# Patient Record
Sex: Female | Born: 1955 | Race: Black or African American | Hispanic: No | Marital: Single | Smoking: Never smoker
Health system: Southern US, Community
[De-identification: ages and names within clinical notes are randomized; demographics above are authoritative.]

## PROBLEM LIST (undated history)

## (undated) DIAGNOSIS — Z950 Presence of cardiac pacemaker: Secondary | ICD-10-CM

## (undated) DIAGNOSIS — E119 Type 2 diabetes mellitus without complications: Secondary | ICD-10-CM

## (undated) DIAGNOSIS — E785 Hyperlipidemia, unspecified: Secondary | ICD-10-CM

## (undated) DIAGNOSIS — I10 Essential (primary) hypertension: Secondary | ICD-10-CM

## (undated) DIAGNOSIS — I499 Cardiac arrhythmia, unspecified: Secondary | ICD-10-CM

## (undated) DIAGNOSIS — Z86711 Personal history of pulmonary embolism: Secondary | ICD-10-CM

## (undated) DIAGNOSIS — R0789 Other chest pain: Secondary | ICD-10-CM

## (undated) DIAGNOSIS — E876 Hypokalemia: Secondary | ICD-10-CM

## (undated) HISTORY — DX: Presence of cardiac pacemaker: Z95.0

## (undated) HISTORY — DX: Hyperlipidemia, unspecified: E78.5

## (undated) HISTORY — PX: HERNIA REPAIR: SHX51

## (undated) HISTORY — DX: Personal history of pulmonary embolism: Z86.711

## (undated) HISTORY — PX: INSERT / REPLACE / REMOVE PACEMAKER: SUR710

---

## 2002-07-23 HISTORY — PX: ABDOMINAL HYSTERECTOMY: SHX81

## 2003-11-23 DIAGNOSIS — Z950 Presence of cardiac pacemaker: Secondary | ICD-10-CM

## 2003-11-23 HISTORY — DX: Presence of cardiac pacemaker: Z95.0

## 2004-08-22 HISTORY — PX: PACEMAKER INSERTION: SHX728

## 2007-02-16 ENCOUNTER — Emergency Department (HOSPITAL_COMMUNITY): Admission: EM | Admit: 2007-02-16 | Discharge: 2007-02-16 | Payer: Self-pay | Admitting: Emergency Medicine

## 2007-02-20 ENCOUNTER — Emergency Department (HOSPITAL_COMMUNITY): Admission: EM | Admit: 2007-02-20 | Discharge: 2007-02-20 | Payer: Self-pay | Admitting: Emergency Medicine

## 2007-10-26 ENCOUNTER — Encounter: Admission: RE | Admit: 2007-10-26 | Discharge: 2007-10-26 | Payer: Self-pay | Admitting: Family Medicine

## 2008-12-14 ENCOUNTER — Emergency Department (HOSPITAL_COMMUNITY): Admission: EM | Admit: 2008-12-14 | Discharge: 2008-12-14 | Payer: Self-pay | Admitting: Emergency Medicine

## 2008-12-29 ENCOUNTER — Emergency Department (HOSPITAL_COMMUNITY): Admission: EM | Admit: 2008-12-29 | Discharge: 2008-12-29 | Payer: Self-pay | Admitting: Emergency Medicine

## 2009-02-26 ENCOUNTER — Encounter: Admission: RE | Admit: 2009-02-26 | Discharge: 2009-02-26 | Payer: Self-pay | Admitting: Infectious Diseases

## 2009-05-26 ENCOUNTER — Inpatient Hospital Stay (HOSPITAL_COMMUNITY): Admission: EM | Admit: 2009-05-26 | Discharge: 2009-05-28 | Payer: Self-pay | Admitting: Emergency Medicine

## 2009-08-18 ENCOUNTER — Inpatient Hospital Stay (HOSPITAL_COMMUNITY): Admission: RE | Admit: 2009-08-18 | Discharge: 2009-08-23 | Payer: Self-pay | Admitting: General Surgery

## 2009-09-27 ENCOUNTER — Emergency Department (HOSPITAL_COMMUNITY): Admission: EM | Admit: 2009-09-27 | Discharge: 2009-09-27 | Payer: Self-pay | Admitting: Emergency Medicine

## 2009-10-05 ENCOUNTER — Emergency Department (HOSPITAL_COMMUNITY): Admission: EM | Admit: 2009-10-05 | Discharge: 2009-10-05 | Payer: Self-pay | Admitting: Emergency Medicine

## 2009-10-08 ENCOUNTER — Encounter: Admission: RE | Admit: 2009-10-08 | Discharge: 2009-10-08 | Payer: Self-pay | Admitting: General Surgery

## 2009-10-11 ENCOUNTER — Emergency Department (HOSPITAL_COMMUNITY): Admission: EM | Admit: 2009-10-11 | Discharge: 2009-10-11 | Payer: Self-pay | Admitting: Emergency Medicine

## 2009-10-12 ENCOUNTER — Emergency Department (HOSPITAL_COMMUNITY): Admission: EM | Admit: 2009-10-12 | Discharge: 2009-10-12 | Payer: Self-pay | Admitting: Emergency Medicine

## 2010-06-30 ENCOUNTER — Encounter: Admission: RE | Admit: 2010-06-30 | Discharge: 2010-06-30 | Payer: Self-pay | Admitting: Family Medicine

## 2010-08-26 IMAGING — CR DG CHEST 2V
2 series · 2 of 2 positions shown · non-contrast
Comparison: Portable chest x-ray of 02/16/2007

CLINICAL DATA: Chest pain, dizziness

CHEST - 2 VIEW

[w chest pa]
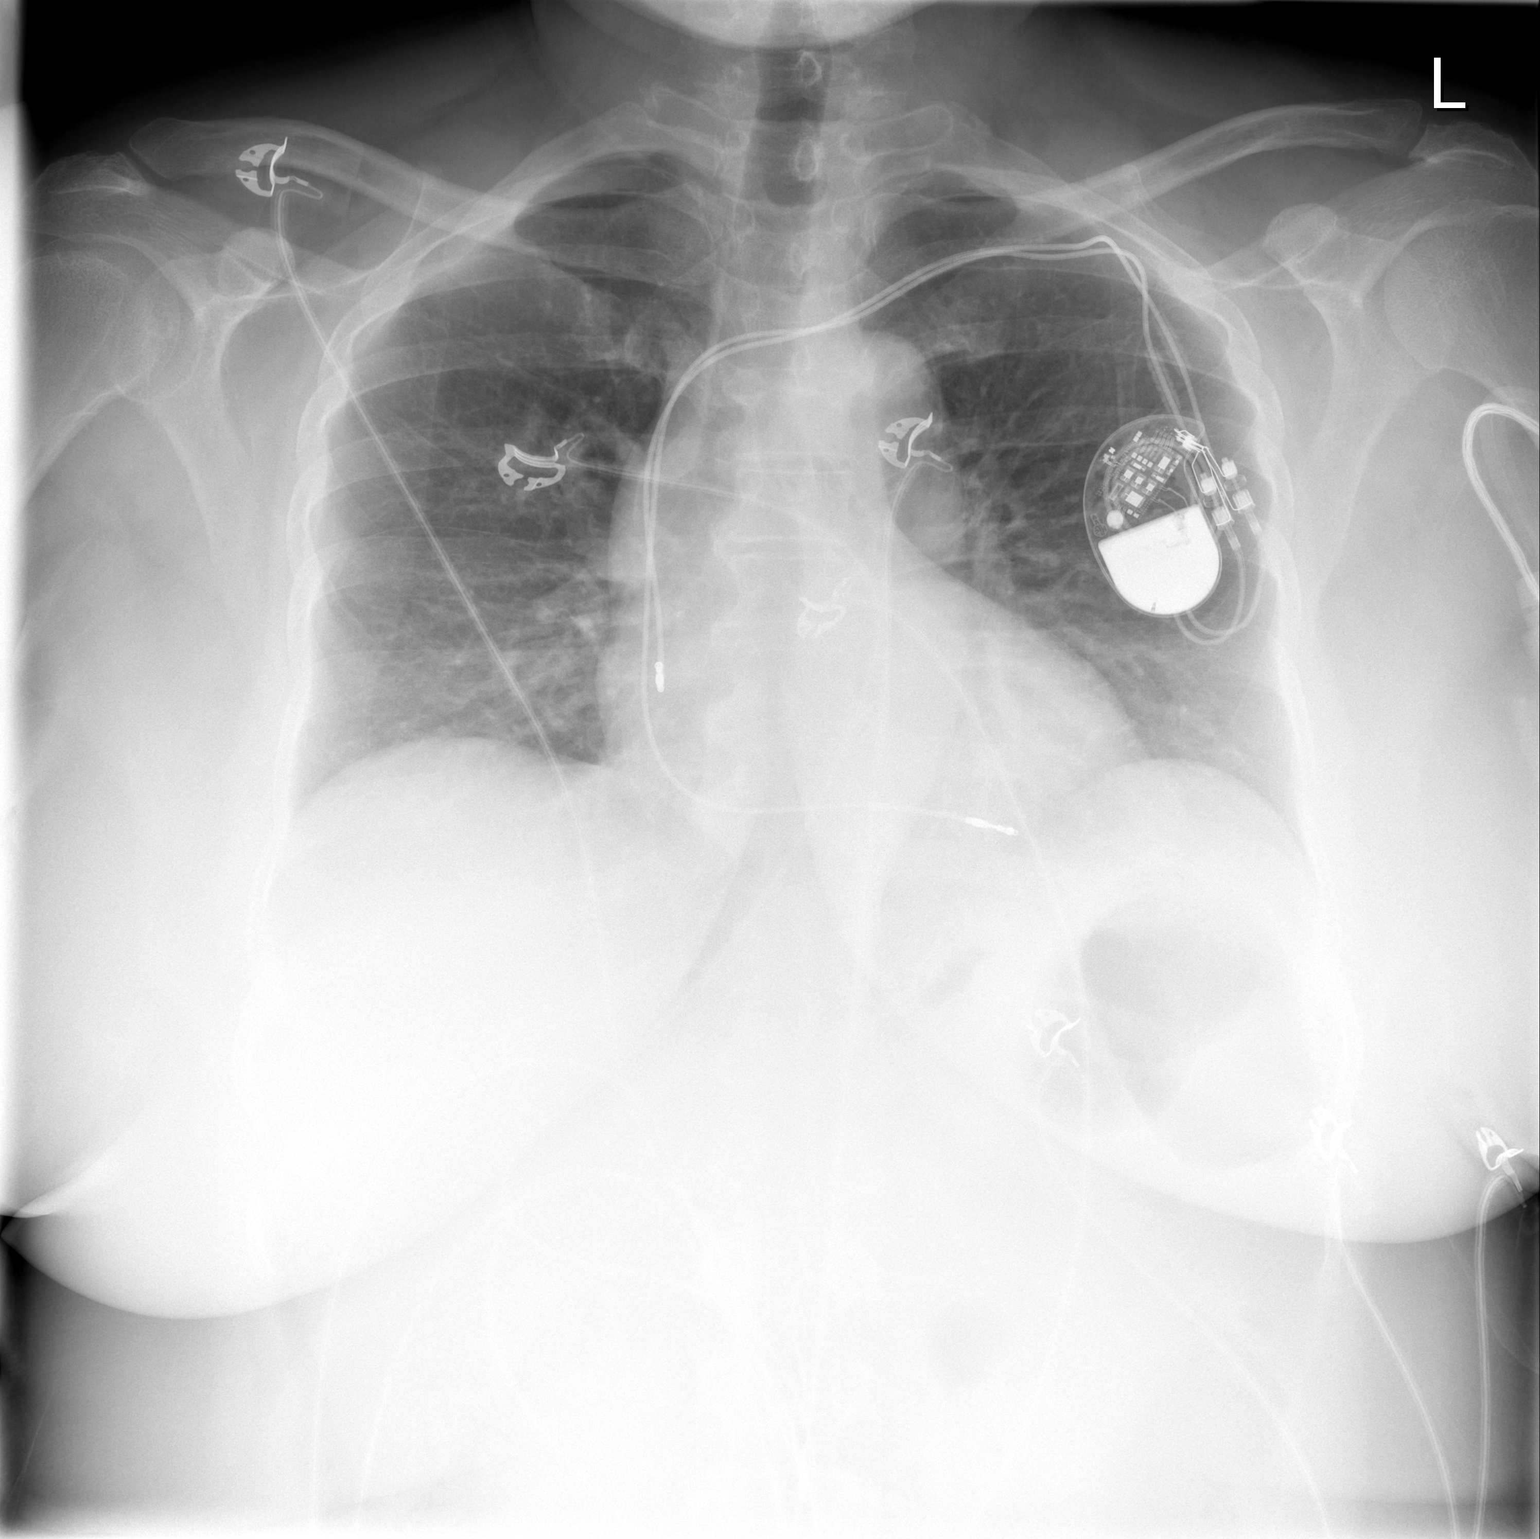

[w chest lat]
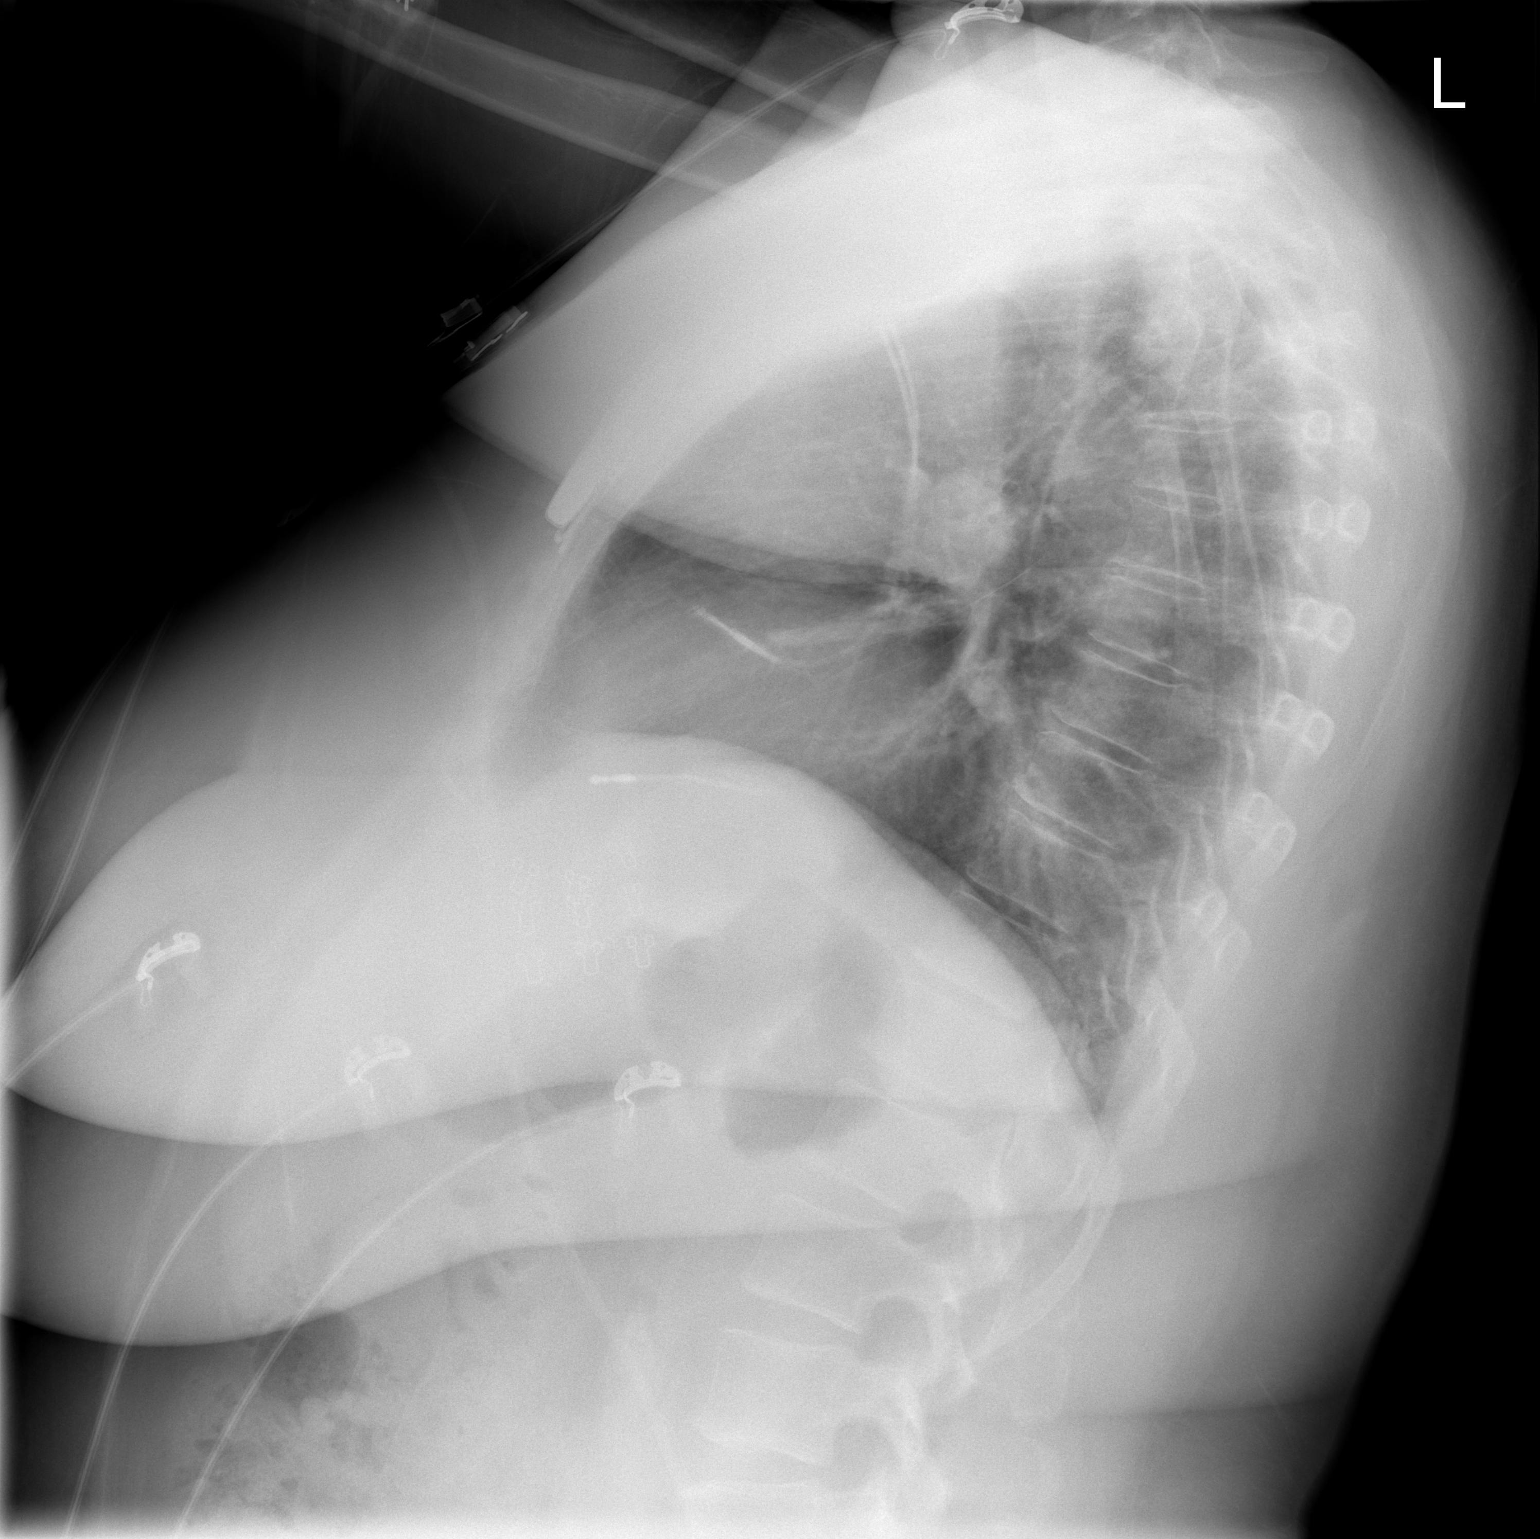

[2 of 2 positions shown; findings below may reference images not displayed]

FINDINGS: The lungs are clear.  Mild cardiomegaly is stable.  A
permanent pacemaker remains.
IMPRESSION: Stable chest x-ray with permanent pacemaker.  No active lung
disease.

## 2010-12-13 ENCOUNTER — Encounter: Payer: Self-pay | Admitting: Family Medicine

## 2011-02-25 LAB — CBC
HCT: 36.1 % (ref 36.0–46.0)
MCHC: 33.4 g/dL (ref 30.0–36.0)
MCV: 84.1 fL (ref 78.0–100.0)
Platelets: 281 10*3/uL (ref 150–400)
RBC: 4.29 MIL/uL (ref 3.87–5.11)

## 2011-02-25 LAB — GLUCOSE, CAPILLARY
Glucose-Capillary: 102 mg/dL — ABNORMAL HIGH (ref 70–99)
Glucose-Capillary: 114 mg/dL — ABNORMAL HIGH (ref 70–99)
Glucose-Capillary: 127 mg/dL — ABNORMAL HIGH (ref 70–99)
Glucose-Capillary: 140 mg/dL — ABNORMAL HIGH (ref 70–99)

## 2011-02-25 LAB — DIFFERENTIAL
Basophils Absolute: 0 10*3/uL (ref 0.0–0.1)
Basophils Relative: 0 % (ref 0–1)

## 2011-02-26 LAB — URINALYSIS, MICROSCOPIC ONLY
Ketones, ur: NEGATIVE mg/dL
Leukocytes, UA: NEGATIVE
Nitrite: NEGATIVE
Specific Gravity, Urine: 1.015 (ref 1.005–1.030)
Urobilinogen, UA: 0.2 mg/dL (ref 0.0–1.0)
pH: 7 (ref 5.0–8.0)

## 2011-02-26 LAB — URINE CULTURE: Culture: NO GROWTH

## 2011-02-26 LAB — CBC
HCT: 34.7 % — ABNORMAL LOW (ref 36.0–46.0)
HCT: 42.3 % (ref 36.0–46.0)
Hemoglobin: 14 g/dL (ref 12.0–15.0)
MCHC: 32.9 g/dL (ref 30.0–36.0)
MCHC: 33 g/dL (ref 30.0–36.0)
MCV: 83.7 fL (ref 78.0–100.0)
MCV: 83.9 fL (ref 78.0–100.0)
MCV: 84.4 fL (ref 78.0–100.0)
Platelets: 232 10*3/uL (ref 150–400)
Platelets: 250 10*3/uL (ref 150–400)
RBC: 5.01 MIL/uL (ref 3.87–5.11)
RDW: 16.1 % — ABNORMAL HIGH (ref 11.5–15.5)
RDW: 16.2 % — ABNORMAL HIGH (ref 11.5–15.5)
WBC: 12.6 10*3/uL — ABNORMAL HIGH (ref 4.0–10.5)
WBC: 4.9 10*3/uL (ref 4.0–10.5)

## 2011-02-26 LAB — COMPREHENSIVE METABOLIC PANEL
ALT: 22 U/L (ref 0–35)
AST: 18 U/L (ref 0–37)
Alkaline Phosphatase: 121 U/L — ABNORMAL HIGH (ref 39–117)
Calcium: 9.5 mg/dL (ref 8.4–10.5)
GFR calc Af Amer: >60 mL/min (ref >60)
GFR calc non Af Amer: >60 mL/min (ref >60)
Glucose, Bld: 152 mg/dL — ABNORMAL HIGH (ref 70–99)
Sodium: 138 mEq/L (ref 135–145)
Total Bilirubin: 0.6 mg/dL (ref 0.3–1.2)

## 2011-02-26 LAB — LIPID PANEL
HDL: 52 mg/dL (ref >39)
LDL Cholesterol: 117 mg/dL — ABNORMAL HIGH (ref 0–99)
Total CHOL/HDL Ratio: 3.5 RATIO
VLDL: 14 mg/dL (ref 0–40)

## 2011-02-26 LAB — GLUCOSE, CAPILLARY
Glucose-Capillary: 106 mg/dL — ABNORMAL HIGH (ref 70–99)
Glucose-Capillary: 114 mg/dL — ABNORMAL HIGH (ref 70–99)
Glucose-Capillary: 127 mg/dL — ABNORMAL HIGH (ref 70–99)
Glucose-Capillary: 132 mg/dL — ABNORMAL HIGH (ref 70–99)
Glucose-Capillary: 15 mg/dL — CL (ref 70–99)
Glucose-Capillary: 210 mg/dL — ABNORMAL HIGH (ref 70–99)

## 2011-02-26 LAB — URINALYSIS, ROUTINE W REFLEX MICROSCOPIC
Glucose, UA: NEGATIVE mg/dL
Nitrite: NEGATIVE
Specific Gravity, Urine: 1.023 (ref 1.005–1.030)
pH: 6 (ref 5.0–8.0)

## 2011-02-26 LAB — BASIC METABOLIC PANEL
BUN: 16 mg/dL (ref 6–23)
CO2: 24 mEq/L (ref 19–32)
Calcium: 8.8 mg/dL (ref 8.4–10.5)
Chloride: 105 mEq/L (ref 96–112)
Creatinine, Ser: 1.01 mg/dL (ref 0.4–1.2)
GFR calc Af Amer: >60 mL/min (ref >60)
GFR calc non Af Amer: 58 mL/min — ABNORMAL LOW (ref >60)
Glucose, Bld: 113 mg/dL — ABNORMAL HIGH (ref 70–99)
Glucose, Bld: 136 mg/dL — ABNORMAL HIGH (ref 70–99)
Potassium: 4 mEq/L (ref 3.5–5.1)
Potassium: 4.4 mEq/L (ref 3.5–5.1)
Sodium: 138 mEq/L (ref 135–145)

## 2011-02-26 LAB — DIFFERENTIAL
Lymphocytes Relative: 43 % (ref 12–46)
Lymphs Abs: 2.1 10*3/uL (ref 0.7–4.0)
Neutro Abs: 2.4 10*3/uL (ref 1.7–7.7)
Neutrophils Relative %: 49 % (ref 43–77)

## 2011-02-26 LAB — URINE MICROSCOPIC-ADD ON

## 2011-02-28 LAB — DIFFERENTIAL
Basophils Absolute: 0 10*3/uL (ref 0.0–0.1)
Basophils Relative: 0 % (ref 0–1)
Eosinophils Absolute: 0 10*3/uL (ref 0.0–0.7)
Eosinophils Absolute: 0.1 10*3/uL (ref 0.0–0.7)
Eosinophils Relative: 1 % (ref 0–5)
Lymphs Abs: 1 10*3/uL (ref 0.7–4.0)
Lymphs Abs: 2.7 10*3/uL (ref 0.7–4.0)
Monocytes Absolute: 0.3 10*3/uL (ref 0.1–1.0)
Monocytes Relative: 3 % (ref 3–12)
Neutrophils Relative %: 40 % — ABNORMAL LOW (ref 43–77)
Neutrophils Relative %: 86 % — ABNORMAL HIGH (ref 43–77)

## 2011-02-28 LAB — URINALYSIS, ROUTINE W REFLEX MICROSCOPIC
Nitrite: NEGATIVE
Specific Gravity, Urine: 1.023 (ref 1.005–1.030)
Urobilinogen, UA: 1 mg/dL (ref 0.0–1.0)
pH: 7 (ref 5.0–8.0)

## 2011-02-28 LAB — COMPREHENSIVE METABOLIC PANEL
ALT: 19 U/L (ref 0–35)
AST: 26 U/L (ref 0–37)
Albumin: 3.9 g/dL (ref 3.5–5.2)
Calcium: 9.7 mg/dL (ref 8.4–10.5)
GFR calc Af Amer: >60 mL/min (ref >60)
Glucose, Bld: 156 mg/dL — ABNORMAL HIGH (ref 70–99)
Sodium: 137 mEq/L (ref 135–145)
Total Protein: 7.2 g/dL (ref 6.0–8.3)

## 2011-02-28 LAB — URINE MICROSCOPIC-ADD ON

## 2011-02-28 LAB — BASIC METABOLIC PANEL
CO2: 28 mEq/L (ref 19–32)
Calcium: 8.9 mg/dL (ref 8.4–10.5)
GFR calc Af Amer: >60 mL/min (ref >60)
GFR calc non Af Amer: >60 mL/min (ref >60)
Sodium: 141 mEq/L (ref 135–145)

## 2011-02-28 LAB — CBC
Hemoglobin: 13.3 g/dL (ref 12.0–15.0)
MCHC: 33.1 g/dL (ref 30.0–36.0)
MCHC: 33.8 g/dL (ref 30.0–36.0)
Platelets: 241 10*3/uL (ref 150–400)
Platelets: 251 10*3/uL (ref 150–400)
RBC: 4.69 MIL/uL (ref 3.87–5.11)
RDW: 15 % (ref 11.5–15.5)

## 2011-02-28 LAB — GLUCOSE, CAPILLARY: Glucose-Capillary: 122 mg/dL — ABNORMAL HIGH (ref 70–99)

## 2011-03-08 LAB — GC/CHLAMYDIA PROBE AMP, GENITAL
Chlamydia, DNA Probe: NEGATIVE
GC Probe Amp, Genital: NEGATIVE

## 2011-03-08 LAB — POCT I-STAT, CHEM 8
BUN: 9 mg/dL (ref 6–23)
Calcium, Ion: 1.24 mmol/L (ref 1.12–1.32)
Chloride: 103 mEq/L (ref 96–112)
Creatinine, Ser: 0.9 mg/dL (ref 0.4–1.2)
TCO2: 25 mmol/L (ref 0–100)

## 2011-03-08 LAB — POCT URINALYSIS DIP (DEVICE)
Bilirubin Urine: NEGATIVE
Glucose, UA: >=1000 mg/dL — AB
Hgb urine dipstick: NEGATIVE
Nitrite: NEGATIVE
Urobilinogen, UA: 0.2 mg/dL (ref 0.0–1.0)

## 2011-03-08 LAB — WET PREP, GENITAL: Yeast Wet Prep HPF POC: NONE SEEN

## 2011-03-09 LAB — GLUCOSE, CAPILLARY: Glucose-Capillary: 163 mg/dL — ABNORMAL HIGH (ref 70–99)

## 2011-03-09 LAB — CBC
Hemoglobin: 15.3 g/dL — ABNORMAL HIGH (ref 12.0–15.0)
MCHC: 33.9 g/dL (ref 30.0–36.0)
MCV: 83.3 fL (ref 78.0–100.0)
RBC: 5.41 MIL/uL — ABNORMAL HIGH (ref 3.87–5.11)
RDW: 14.9 % (ref 11.5–15.5)

## 2011-03-09 LAB — BASIC METABOLIC PANEL
CO2: 25 mEq/L (ref 19–32)
Calcium: 9.9 mg/dL (ref 8.4–10.5)
Chloride: 107 mEq/L (ref 96–112)
Creatinine, Ser: 0.75 mg/dL (ref 0.4–1.2)
GFR calc Af Amer: >60 mL/min (ref >60)
Glucose, Bld: 176 mg/dL — ABNORMAL HIGH (ref 70–99)

## 2011-03-09 LAB — URINALYSIS, ROUTINE W REFLEX MICROSCOPIC
Bilirubin Urine: NEGATIVE
Glucose, UA: 100 mg/dL — AB
Ketones, ur: NEGATIVE mg/dL
Protein, ur: NEGATIVE mg/dL
pH: 7 (ref 5.0–8.0)

## 2011-03-09 LAB — POCT CARDIAC MARKERS
CKMB, poc: <1 ng/mL — ABNORMAL LOW (ref 1.0–8.0)
Troponin i, poc: <0.05 ng/mL (ref 0.00–0.09)

## 2011-04-06 NOTE — H&P (Signed)
Lauren Wilkins, PAGLIARULO NO.:  [id number]   MEDICAL RECORD NO.:  [id number]          PATIENT TYPE:  INP   LOCATION:  5125                         FACILITY:  [facility]   PHYSICIAN:  Ollen Gross. Vernell Morgans, M.D. DATE OF BIRTH:  December 25, 1955   DATE OF ADMISSION:  05/26/2009  DATE OF DISCHARGE:                              HISTORY & PHYSICAL   HISTORY OF PRESENT ILLNESS:  [name] is a 55 year old black female  who presents to the emergency department with abdominal pain that  started last night.  Pain, she described initially as a sharp, a sort of  lower abdominal pain, and then she has had a sort of a dull like that  has persisted.  She denies any fevers or chills.  She vomited 1 time.  She did have a normal bowel movement on Saturday.  No flatus today.  She  otherwise denies any chest pain, shortness of breath, diarrhea, or  dysuria.   PAST MEDICAL HISTORY:  Significant for slow heart rate requiring a  pacer.   PAST SURGICAL HISTORY:  Significant for hysterectomy.   MEDICATIONS:  Diovan and Caduet.   ALLERGIES:  No known drug allergies.   SOCIAL HISTORY:  She denies any tobacco or tobacco products.   FAMILY HISTORY:  Noncontributory.   PHYSICAL EXAMINATION:  VITAL SIGNS:  Her temperature 98, pulse 76, and  blood pressure 128/78.  GENERAL:  She is a well-developed, well-nourished black female, in no  acute distress.  SKIN:  Warm and dry.  No jaundice.  HEENT:  Eyes are anicteric.  Extraocular movements are intact.  Pupils  are equal, round, and reactive to light.  Sclerae nonicteric.  LUNGS:  Clear bilaterally with no use of accessory respiratory muscles.  HEART:  Regular rate and rhythm with an impulse in the left chest.  ABDOMEN:  Soft.  She has some mild diffuse tenderness and mild  distention.  EXTREMITIES:  No cyanosis, clubbing, or edema.  Good strength in arms  and legs.  PSYCHOLOGIC:  She is alert and oriented x3 with no evidence today of  anxiety or  depression.   LABORATORY DATA:  On review of her lab work, it was significant for a  normal white count.  CT scan was reviewed with radiologist and was  significant for what looks like a partial small bowel obstruction  possibly related to ventral hernia on her lower midline.   ASSESSMENT AND PLAN:  This is a 55 year old black female with what looks  like a partial small bowel obstruction possibly related to ventral  hernia.  On palpation of her abdominal wall, I could not feel a lump  consistent with an incarcerated hernia given that her white counts are  normal and her belly is actually pretty soft, I think it would be  reasonable to put her in the hospital for IV hydration and bowel rest.  We will repeat her abdominal x-rays and lab work in the morning.  If  this does not improve then she may need to have a surgery to repair the  hernia and  relieve the bowel obstruction.  She understands this and in agreement  with this treatment plan.  She also saw her cardiologist, Dr. Lynnea Ferrier,  at [facility] this past Thursday and by her report,  told her everything was great.      Ollen Gross. Vernell Morgans, M.D.  Electronically Signed     PST/MEDQ  D:  05/26/2009  T:  05/27/2009  Job:  [id number]

## 2011-05-24 ENCOUNTER — Inpatient Hospital Stay (INDEPENDENT_AMBULATORY_CARE_PROVIDER_SITE_OTHER)
Admission: RE | Admit: 2011-05-24 | Discharge: 2011-05-24 | Disposition: A | Discharge status: Home / Self Care | Payer: 59 | Source: Ambulatory Visit | Attending: Family Medicine | Admitting: Family Medicine

## 2011-05-24 DIAGNOSIS — R197 Diarrhea, unspecified: Secondary | ICD-10-CM

## 2011-05-24 LAB — POCT I-STAT, CHEM 8
BUN: 24 mg/dL — ABNORMAL HIGH (ref 6–23)
Chloride: 100 mEq/L (ref 96–112)
HCT: 48 % — ABNORMAL HIGH (ref 36.0–46.0)
Potassium: 4 mEq/L (ref 3.5–5.1)

## 2011-05-24 LAB — POCT URINALYSIS DIP (DEVICE)
Glucose, UA: 500 mg/dL — AB
Specific Gravity, Urine: 1.01 (ref 1.005–1.030)

## 2011-06-16 ENCOUNTER — Other Ambulatory Visit: Payer: Self-pay | Admitting: Family Medicine

## 2011-06-16 DIAGNOSIS — Z1231 Encounter for screening mammogram for malignant neoplasm of breast: Secondary | ICD-10-CM

## 2011-07-06 ENCOUNTER — Ambulatory Visit: Payer: 59

## 2011-07-08 ENCOUNTER — Ambulatory Visit: Payer: 59

## 2011-07-15 ENCOUNTER — Ambulatory Visit: Payer: 59

## 2011-09-22 ENCOUNTER — Ambulatory Visit
Admission: RE | Admit: 2011-09-22 | Discharge: 2011-09-22 | Disposition: A | Discharge status: Home / Self Care | Payer: 59 | Source: Ambulatory Visit | Attending: Family Medicine | Admitting: Family Medicine

## 2011-09-22 DIAGNOSIS — Z1231 Encounter for screening mammogram for malignant neoplasm of breast: Secondary | ICD-10-CM

## 2011-09-29 ENCOUNTER — Other Ambulatory Visit: Payer: Self-pay | Admitting: Family Medicine

## 2011-09-29 DIAGNOSIS — R928 Other abnormal and inconclusive findings on diagnostic imaging of breast: Secondary | ICD-10-CM

## 2011-10-15 ENCOUNTER — Other Ambulatory Visit: Payer: 59

## 2011-10-23 LAB — HM PAP SMEAR: HM Pap smear: NORMAL

## 2011-11-01 ENCOUNTER — Ambulatory Visit
Admission: RE | Admit: 2011-11-01 | Discharge: 2011-11-01 | Disposition: A | Discharge status: Home / Self Care | Payer: 59 | Source: Ambulatory Visit | Attending: Family Medicine | Admitting: Family Medicine

## 2011-11-01 DIAGNOSIS — R928 Other abnormal and inconclusive findings on diagnostic imaging of breast: Secondary | ICD-10-CM

## 2012-01-20 ENCOUNTER — Emergency Department (HOSPITAL_BASED_OUTPATIENT_CLINIC_OR_DEPARTMENT_OTHER)
Admission: EM | Admit: 2012-01-20 | Discharge: 2012-01-20 | Discharge status: Against Medical Advice | Payer: 59 | Attending: Emergency Medicine | Admitting: Emergency Medicine

## 2012-01-20 ENCOUNTER — Other Ambulatory Visit: Payer: Self-pay

## 2012-01-20 ENCOUNTER — Emergency Department (INDEPENDENT_AMBULATORY_CARE_PROVIDER_SITE_OTHER): Payer: 59

## 2012-01-20 ENCOUNTER — Encounter (HOSPITAL_BASED_OUTPATIENT_CLINIC_OR_DEPARTMENT_OTHER): Payer: Self-pay | Admitting: *Deleted

## 2012-01-20 DIAGNOSIS — E119 Type 2 diabetes mellitus without complications: Secondary | ICD-10-CM | POA: Insufficient documentation

## 2012-01-20 DIAGNOSIS — I10 Essential (primary) hypertension: Secondary | ICD-10-CM | POA: Insufficient documentation

## 2012-01-20 DIAGNOSIS — R079 Chest pain, unspecified: Secondary | ICD-10-CM

## 2012-01-20 DIAGNOSIS — Z950 Presence of cardiac pacemaker: Secondary | ICD-10-CM | POA: Insufficient documentation

## 2012-01-20 DIAGNOSIS — R9431 Abnormal electrocardiogram [ECG] [EKG]: Secondary | ICD-10-CM

## 2012-01-20 DIAGNOSIS — R0789 Other chest pain: Secondary | ICD-10-CM

## 2012-01-20 HISTORY — DX: Essential (primary) hypertension: I10

## 2012-01-20 LAB — BASIC METABOLIC PANEL
Calcium: 10.7 mg/dL — ABNORMAL HIGH (ref 8.4–10.5)
GFR calc Af Amer: 83 mL/min — ABNORMAL LOW (ref >90)
GFR calc non Af Amer: 72 mL/min — ABNORMAL LOW (ref >90)
Glucose, Bld: 156 mg/dL — ABNORMAL HIGH (ref 70–99)
Potassium: 3.2 mEq/L — ABNORMAL LOW (ref 3.5–5.1)
Sodium: 139 mEq/L (ref 135–145)

## 2012-01-20 LAB — CBC
HCT: 40.8 % (ref 36.0–46.0)
Hemoglobin: 14.2 g/dL (ref 12.0–15.0)
MCHC: 34.8 g/dL (ref 30.0–36.0)
MCV: 79.4 fL (ref 78.0–100.0)
Platelets: 277 10*3/uL (ref 150–400)
RBC: 5.14 MIL/uL — ABNORMAL HIGH (ref 3.87–5.11)
RDW: 14.4 % (ref 11.5–15.5)
WBC: 7.2 10*3/uL (ref 4.0–10.5)

## 2012-01-20 MED ORDER — POTASSIUM CHLORIDE CRYS ER 20 MEQ PO TBCR: 40.0000 meq | EXTENDED_RELEASE_TABLET | Freq: Once | ORAL | Status: DC

## 2012-01-20 MED ORDER — ASPIRIN 81 MG PO CHEW
243.0000 mg | CHEWABLE_TABLET | Freq: Once | ORAL | Status: AC
  Administered 2012-01-20: 243 mg via ORAL

## 2012-01-20 MED ORDER — ASPIRIN 81 MG PO CHEW
324.0000 mg | CHEWABLE_TABLET | Freq: Once | ORAL | Status: DC
  Filled 2012-01-20: qty 4

## 2012-01-20 MED ORDER — NITROGLYCERIN 0.4 MG SL SUBL
0.4000 mg | SUBLINGUAL_TABLET | SUBLINGUAL | Status: DC | PRN
  Filled 2012-01-20: qty 25

## 2012-01-20 NOTE — ED Notes (Signed)
Pt c/o midsternal CP since 11pm last night. Constant in nature. Pt felt like it may be heart burn, however tightness did not relieve. Pt took 1 baby aspirin PTA @ 0400. Denies any associated symptoms. Pt has a pacemaker.

## 2012-01-20 NOTE — ED Notes (Signed)
Pt refused offer of any further treatment

## 2012-01-20 NOTE — ED Provider Notes (Signed)
History     CSN: [id number]  Arrival date & time 01/20/12  0531   First MD Initiated Contact with Patient 01/20/12 0532      Chief Complaint  Patient presents with  . Chest Pain    (Consider location/radiation/quality/duration/timing/severity/associated sxs/prior treatment) Patient is a 56 y.o. female presenting with chest pain. The history is provided by the patient.  Chest Pain The chest pain began 3 - 5 hours ago. Duration of episode(s) is 5 hours. Chest pain occurs constantly. The chest pain is resolved. Associated with: Nothing. At its most intense, the pain is at 5/10. The pain is currently at 0/10. The severity of the pain is moderate. The quality of the pain is described as tightness. The pain does not radiate. Pertinent negatives for primary symptoms include no fever, no fatigue, no syncope, no shortness of breath, no cough, no wheezing, no palpitations, no abdominal pain, no nausea, no vomiting, no dizziness and no altered mental status.  Pertinent negatives for associated symptoms include no claudication, no diaphoresis and no near-syncope. She tried aspirin for the symptoms.  Her past medical history is significant for diabetes and hypertension.  Pertinent negatives for past medical history include no CAD and no PE.  Her family medical history is significant for heart disease in family.    at work tonight developed some sternal chest pressure and tightness without any aggravating or alleviating symptoms. She works in the [employer] and denies any exertional activity tonight. She take 1 daily aspirin continue to work with no change in her symptoms. At time she arrived to the emergency department her pain resolved. No cough, cold or congestion. No fevers or chills. No leg pain or leg swelling. No trauma. No history of tobacco use or asthma. No known sick contacts. No history of similar symptoms in the past. She has a pacemaker placed about 7 years ago by [facility] and has no known coronary artery disease.  Past Medical History  Diagnosis Date  . Diabetes mellitus   . Hypertension     Past Surgical History  Procedure Date  . Pacemaker insertion   . Abdominal hysterectomy   . Hernia repair     No family history on file.  History  Substance Use Topics  . Smoking status: Never Smoker   . Smokeless tobacco: Not on file  . Alcohol Use: No    OB History    Grav Para Term Preterm Abortions TAB SAB Ect Mult Living                  Review of Systems  Constitutional: Negative for fever, chills, diaphoresis and fatigue.  HENT: Negative for neck pain and neck stiffness.   Eyes: Negative for pain.  Respiratory: Negative for cough, shortness of breath and wheezing.   Cardiovascular: Positive for chest pain. Negative for palpitations, claudication, syncope and near-syncope.  Gastrointestinal: Negative for nausea, vomiting and abdominal pain.  Genitourinary: Negative for dysuria.  Musculoskeletal: Negative for back pain.  Skin: Negative for rash.  Neurological: Negative for dizziness and headaches.  Psychiatric/Behavioral: Negative for altered mental status.  All other systems reviewed and are negative.    Allergies  Review of patient's allergies indicates no known allergies.  Home Medications   Current Outpatient Rx  Name Route Sig Dispense Refill  . CADUET PO Oral Take by mouth.    . ASPIRIN 81 MG PO TABS Oral Take 81 mg by mouth daily.    Marland Kitchen HYDROCHLOROTHIAZIDE PO Oral  Take by mouth.    . METFORMIN HCL 500 MG PO TABS Oral Take 500 mg by mouth 2 (two) times daily with a meal.    . BYSTOLIC PO Oral Take by mouth.      BP 131/85  Pulse 68  Temp(Src) 97.4 F (36.3 C) (Oral)  Resp 16  Ht 5\' 6"  (1.676 m)  Wt 240 lb (108.863 kg)  BMI 38.74 kg/m2  SpO2 100%  Physical Exam  Constitutional: She is oriented to person, place, and time. She appears well-developed and well-nourished.  HENT:  Head: Normocephalic and  atraumatic.  Eyes: Conjunctivae and EOM are normal. Pupils are equal, round, and reactive to light.  Neck: Trachea normal. Neck supple. No thyromegaly present.  Cardiovascular: Normal rate, regular rhythm, S1 normal, S2 normal and normal pulses.     No systolic murmur is present   No diastolic murmur is present  Pulses:      Radial pulses are 2+ on the right side, and 2+ on the left side.  Pulmonary/Chest: Effort normal and breath sounds normal. She has no wheezes. She has no rhonchi. She has no rales. She exhibits no tenderness.  Abdominal: Soft. Normal appearance and bowel sounds are normal. There is no tenderness. There is no CVA tenderness and negative Murphy's sign.  Musculoskeletal:       BLE:s Calves nontender, no cords or erythema, negative Homans sign  Neurological: She is alert and oriented to person, place, and time. She has normal strength. No cranial nerve deficit or sensory deficit. GCS eye subscore is 4. GCS verbal subscore is 5. GCS motor subscore is 6.  Skin: Skin is warm and dry. No rash noted. She is not diaphoretic.  Psychiatric: Her speech is normal.       Cooperative and appropriate    ED Course  Procedures (including critical care time)  Results for orders placed during the hospital encounter of 01/20/12  CBC      Component Value Range   WBC 7.2  4.0 - 10.5 (K/uL)   RBC 5.14 (*) 3.87 - 5.11 (MIL/uL)   Hemoglobin 14.2  12.0 - 15.0 (g/dL)   HCT 40.9  81.1 - 91.4 (%)   MCV 79.4  78.0 - 100.0 (fL)   MCH 27.6  26.0 - 34.0 (pg)   MCHC 34.8  30.0 - 36.0 (g/dL)   RDW 78.2  95.6 - 21.3 (%)   Platelets 277  150 - 400 (K/uL)  BASIC METABOLIC PANEL      Component Value Range   Sodium 139  135 - 145 (mEq/L)   Potassium 3.2 (*) 3.5 - 5.1 (mEq/L)   Chloride 102  96 - 112 (mEq/L)   CO2 25  19 - 32 (mEq/L)   Glucose, Bld 156 (*) 70 - 99 (mg/dL)   BUN 15  6 - 23 (mg/dL)   Creatinine, Ser 0.86  0.50 - 1.10 (mg/dL)   Calcium 57.8 (*) 8.4 - 10.5 (mg/dL)   GFR calc non Af  Amer 72 (*) >90 (mL/min)   GFR calc Af Amer 83 (*) >90 (mL/min)  TROPONIN I      Component Value Range   Troponin I <0.30  <0.30 (ng/mL)    Date: 01/20/2012  Rate: 69  Rhythm: normal sinus rhythm  QRS Axis: normal  Intervals: normal  ST/T Wave abnormalities: nonspecific ST/T changes  Conduction Disutrbances:none  Narrative Interpretation: New inverted T waves V3 V4 V5 T wave flattening in V6 versus 05/26/2009  Old EKG Reviewed: changes noted  1. Chest pain     6:49 AM I had a very long discussion with patient regarding EKG changes, risk factors for heart disease and ACS, and my very high recommendations that she be admitted to the hospital for further cardiac evaluation and evaluation by her cardiologist. Patient has been pain-free since she the emergency department, she understands that her initial blood work including CBC and troponin are normal range, and that does not by any means excluded acute MI, ACS or other serious potential etiology of her symptoms. She is alert and oriented x4, she has no indication for IVC, she verbalizes understanding risks associated with leaving AMA including missed MI and possibly death. She wishes to be discharged home to see her cardiologist as an outpatient. She states understanding multiple times I highly recommend she be admitted for further evaluation. I offered to speak with her cardiologist or one of his partners and she still declines transfer or admission. She understands that she can return anytime she changes her mind. AMA form signed.   MDM   Chest pain with EKG changes and risk factors for ACS. I have a recommended admission and further evaluation and patient declines. She wants to see her cardiologist in the clinic you know she understands that with admission she potentialywill see her cardiologist or one of his partners. Despite this she signs out AMA        Sunnie Nielsen, MD 01/20/12 [phone]

## 2012-01-20 NOTE — ED Notes (Signed)
Pt continues to deny chest tightness. States she feels at her baseline.

## 2012-01-20 NOTE — Discharge Instructions (Signed)
I highly recommend that you be admitted to the hospital for further evaluation of your chest pain and abnormal EKG. Your risk factors for heart disease in your symptoms could represent a heart attack. Return here anytime he change of mind about being admitted. Otherwise followup with your cardiologist immediately for further evaluation.      Heart Attack in Women  Heart attack (myocardial infarction) is one of the leading causes of sudden, unexpected death in women. Early recognition of heart attack symptoms is critical. Do not ignore heart attack symptoms. If you experience heart attack symptoms, get immediate help. Early treatment helps reduce heart damage.  CAUSES  A heart attack happens because the heart (coronary) arteries become blocked by fatty deposits (plaque) or blood clots. This reduces the oxygen and blood supply to the heart. When one or more of the heart arteries becomes blocked, that area of the heart will begin to die, causing the pain felt during a heart attack.  RISK FACTORS  In women, as the level of estrogen in the blood decreases after menopause, the risk of heart attack increases. Other risk factors of heart attack in women include:  High blood pressure.  High cholesterol levels.  Diabetes.  Smoking.  Obesity.  Menopause.  Hysterectomy.  Previous heart attack.  Lack of regular exercise.  Family history of heart attacks.  SYMPTOMS  In women, heart attack symptoms may be different than those in men. Women may not experience the typical chest discomfort or pain, which is considered the primary heart attack symptom in men. Women may describe a feeling of pressure, ache, or tightness in the chest. Women may experience new or different physical symptoms sometimes a month or more before a heart attack. Unusual, unexplained fatigue may be the most frequently identified symptom. Sleep disturbances and weakness in the arms may also be considered warning signs.  Other heart attack  symptoms that may occur more often in women are:  Unexplained feelings of nervousness or anxiety.  Discomfort between the shoulder blades.  Tingling in the hands and arms.  Swollen arms.  Headaches.  Heart attack symptoms for both men and women include:  Pain or discomfort spreading to the neck, shoulder, arm, or jaw.  Shortness of breath.  Sudden cold sweats.  Pain or discomfort in the abdomen.  Heartburn or indigestion with or without vomiting.  Sudden lightheadedness.  Sudden fainting or blackout.  PREVENTION  The following healthy lifestyle habits may help decrease your risk of heart attacks:  Quitting smoking.  Keeping your blood pressure, blood sugar, and cholesterol levels within normal limits.  Maintaining a healthy weight.  Staying physically active and exercising regularly.  Decreasing your salt intake.  Eating a diet low in saturated fats and cholesterol.  Increasing your fiber intake by including whole grains, vegetables, and fruits in your diet.  Avoiding situations that cause stress, anger, or depression.  Taking medicine as advised by your caregiver.  SEEK IMMEDIATE MEDICAL CARE IF:  You have pain or discomfort in the middle of your chest.  You have pain or discomfort in the upper part of your body, such as the arms, back, neck, jaw, or stomach.  You develop shortness of breath.  You break out into a cold sweat.  You feel nauseous or lightheaded.  You have a fainting episode.  You feel very unusual weakness.  You feel that your heart is pounding very hard or is beating irregularly.  MAKE SURE YOU:  Understand these instructions.  Will watch your  condition.  Will get help right away if you are not doing well or get worse.

## 2012-01-20 NOTE — ED Notes (Signed)
Pt transported to XR at this time.

## 2012-01-20 NOTE — ED Notes (Signed)
AMA form signed by patient

## 2012-01-20 NOTE — ED Notes (Signed)
Pt reports lying down helped to improve the chest tightness, and denies any tightness at this time.

## 2012-01-20 NOTE — ED Notes (Signed)
Dr. Opitz at bedside. 

## 2012-01-20 NOTE — ED Notes (Signed)
Pt reports pacemaker is on demand only.

## 2012-01-20 NOTE — ED Notes (Addendum)
Pt states she does not want to be admitted into the hospital. Pt reports resolved pain, and states that d/t financial reasons, "i just dont want to put myself into further debt by being in the hospital". Pt understands the risks of leaving. Dr Dierdre Highman spoke to her at length regarding her test results and why he feels like she needs further examination at the hospital. Pt states she understands the risks of leaving, and is still requesting to sign out AMA. Pt states she will call her cardiologist today for f/u. States she already has an appt tomorrow with PMD.

## 2012-01-22 ENCOUNTER — Emergency Department (HOSPITAL_COMMUNITY)
Admission: EM | Admit: 2012-01-22 | Discharge: 2012-01-22 | Disposition: A | Discharge status: Home / Self Care | Payer: 59 | Attending: Emergency Medicine | Admitting: Emergency Medicine

## 2012-01-22 ENCOUNTER — Encounter (HOSPITAL_COMMUNITY): Payer: Self-pay | Admitting: Emergency Medicine

## 2012-01-22 ENCOUNTER — Other Ambulatory Visit: Payer: Self-pay

## 2012-01-22 DIAGNOSIS — R0789 Other chest pain: Secondary | ICD-10-CM

## 2012-01-22 DIAGNOSIS — Z79899 Other long term (current) drug therapy: Secondary | ICD-10-CM | POA: Insufficient documentation

## 2012-01-22 DIAGNOSIS — Z7982 Long term (current) use of aspirin: Secondary | ICD-10-CM | POA: Insufficient documentation

## 2012-01-22 DIAGNOSIS — E876 Hypokalemia: Secondary | ICD-10-CM

## 2012-01-22 DIAGNOSIS — R9431 Abnormal electrocardiogram [ECG] [EKG]: Secondary | ICD-10-CM | POA: Insufficient documentation

## 2012-01-22 DIAGNOSIS — Z950 Presence of cardiac pacemaker: Secondary | ICD-10-CM | POA: Insufficient documentation

## 2012-01-22 DIAGNOSIS — E119 Type 2 diabetes mellitus without complications: Secondary | ICD-10-CM | POA: Diagnosis present

## 2012-01-22 DIAGNOSIS — I10 Essential (primary) hypertension: Secondary | ICD-10-CM | POA: Insufficient documentation

## 2012-01-22 HISTORY — DX: Type 2 diabetes mellitus without complications: E11.9

## 2012-01-22 HISTORY — DX: Hypokalemia: E87.6

## 2012-01-22 HISTORY — DX: Other chest pain: R07.89

## 2012-01-22 HISTORY — DX: Cardiac arrhythmia, unspecified: I49.9

## 2012-01-22 LAB — POCT I-STAT TROPONIN I: Troponin i, poc: 0 ng/mL (ref 0.00–0.08)

## 2012-01-22 LAB — CBC
MCV: 82.2 fL (ref 78.0–100.0)
Platelets: 262 10*3/uL (ref 150–400)
RBC: 5.34 MIL/uL — ABNORMAL HIGH (ref 3.87–5.11)
RDW: 14.6 % (ref 11.5–15.5)
WBC: 6 10*3/uL (ref 4.0–10.5)

## 2012-01-22 LAB — DIFFERENTIAL
Basophils Absolute: 0 10*3/uL (ref 0.0–0.1)
Eosinophils Absolute: 0.1 10*3/uL (ref 0.0–0.7)
Eosinophils Relative: 1 % (ref 0–5)
Lymphocytes Relative: 40 % (ref 12–46)
Lymphs Abs: 2.4 10*3/uL (ref 0.7–4.0)
Neutrophils Relative %: 53 % (ref 43–77)

## 2012-01-22 LAB — BASIC METABOLIC PANEL
Calcium: 10.8 mg/dL — ABNORMAL HIGH (ref 8.4–10.5)
GFR calc non Af Amer: 65 mL/min — ABNORMAL LOW (ref >90)
Potassium: 3.4 mEq/L — ABNORMAL LOW (ref 3.5–5.1)
Sodium: 139 mEq/L (ref 135–145)

## 2012-01-22 MED ORDER — POTASSIUM CHLORIDE CRYS ER 20 MEQ PO TBCR
40.0000 meq | EXTENDED_RELEASE_TABLET | Freq: Once | ORAL | Status: AC
  Administered 2012-01-22: 40 meq via ORAL
  Filled 2012-01-22: qty 2

## 2012-01-22 NOTE — ED Notes (Signed)
Pt resting quietly.  States she feels better.  

## 2012-01-22 NOTE — ED Provider Notes (Signed)
Medical screening examination/treatment/procedure(s) were conducted as a shared visit with non-physician practitioner(s) and myself.  I personally evaluated the patient during the encounter  New onset EKG abnormality and 01/20/12, with ongoing chest pressure, for 3 days. Patient spontaneously improved in the emergency department. Repeat EKG is unchanged from 2/28.   I discussed with the cardiologist, on call for her, cardiologist. He'll see her in the emergency dept.    Flint Melter, MD 01/22/12 1945

## 2012-01-22 NOTE — ED Notes (Signed)
Pt states she had cxr yesterday.  Md informed and Xray canceled.

## 2012-01-22 NOTE — ED Notes (Signed)
Pt reports seen at [facility] for chest tightness. Pt had abnormal EKG and was told to come to [facility] [place] that day. Pt talked to her cardiologist at [facility] and was told to come to ED. Pt continues to have chest tightness.

## 2012-01-22 NOTE — ED Notes (Signed)
Previous EKGs handed to [place], [place]

## 2012-01-22 NOTE — ED Notes (Signed)
Patient is resting comfortably. Patient sleeping 

## 2012-01-22 NOTE — Consult Note (Signed)
Reason for Consult: abnormal EKG   Referring Physician: ER MD   Lauren Wilkins is an 56 y.o. female.    Chief Complaint: Chest pressure on Thursday and Friday   HPI: 56 year old AA female, presents to ER today after episode on this past Thursday  When she was seen at [facility].  Her EKG was mildly abnormal and she was instructed to go to ER at [facility].  Pt. Did not wish to go at that time so she came today.  Her chest pressure resolved yesterday.  Currently without pain.    With her midsternal chest pressure she had no associated symptoms.    Past cardiac history of a pacemaker secondary to syncope and a tilt test was done.  This was 2005.  She also has a PPM.  Past Medical History  Diagnosis Date  . Diabetes mellitus   . Hypertension   . Chest pressure, resolved 01/22/2012  . DM (diabetes mellitus) 01/22/2012  . Hypokalemia 01/22/2012  . Dysrhythmia     Past Surgical History  Procedure Date  . Pacemaker insertion   . Abdominal hysterectomy   . Hernia repair   . Insert / replace / remove pacemaker     Family History  Problem Relation Age of Onset  . Coronary artery disease Father    Social History:  reports that she has never smoked. She has never used smokeless tobacco. She reports that she does not drink alcohol or use illicit drugs. Single no children  Allergies: No Known Allergies  Medications Prior to Admission  Medication Dose Route Frequency Provider Last Rate Last Dose  . potassium chloride SA (K-DUR,KLOR-CON) CR tablet 40 mEq  40 mEq Oral Once Leone Brand, NP       Medications Prior to Admission  Medication Sig Dispense Refill  . aspirin 81 MG tablet Take 81 mg by mouth daily.      . metFORMIN (GLUCOPHAGE) 500 MG tablet Take 500 mg by mouth daily.       Caduet 10/20 daily Valsartan/HCTZ 160/25 daily Bystolic 10 mg daily Janumet 500/1000 mg daily   Results for orders placed during the hospital encounter of 01/22/12 (from the past 48 hour(s))  CBC      Status: Abnormal   Collection Time   01/22/12  9:05 AM      Component Value Range Comment   WBC 6.0  4.0 - 10.5 (K/uL)    RBC 5.34 (*) 3.87 - 5.11 (MIL/uL)    Hemoglobin 14.7  12.0 - 15.0 (g/dL)    HCT 13.0  86.5 - 78.4 (%)    MCV 82.2  78.0 - 100.0 (fL)    MCH 27.5  26.0 - 34.0 (pg)    MCHC 33.5  30.0 - 36.0 (g/dL)    RDW 69.6  29.5 - 28.4 (%)    Platelets 262  150 - 400 (K/uL)   DIFFERENTIAL     Status: Normal   Collection Time   01/22/12  9:05 AM      Component Value Range Comment   Neutrophils Relative 53  43 - 77 (%)    Neutro Abs 3.2  1.7 - 7.7 (K/uL)    Lymphocytes Relative 40  12 - 46 (%)    Lymphs Abs 2.4  0.7 - 4.0 (K/uL)    Monocytes Relative 5  3 - 12 (%)    Monocytes Absolute 0.3  0.1 - 1.0 (K/uL)    Eosinophils Relative 1  0 - 5 (%)  Eosinophils Absolute 0.1  0.0 - 0.7 (K/uL)    Basophils Relative 0  0 - 1 (%)    Basophils Absolute 0.0  0.0 - 0.1 (K/uL)   BASIC METABOLIC PANEL     Status: Abnormal   Collection Time   01/22/12  9:05 AM      Component Value Range Comment   Sodium 139  135 - 145 (mEq/L)    Potassium 3.4 (*) 3.5 - 5.1 (mEq/L)    Chloride 102  96 - 112 (mEq/L)    CO2 25  19 - 32 (mEq/L)    Glucose, Bld 174 (*) 70 - 99 (mg/dL)    BUN 18  6 - 23 (mg/dL)    Creatinine, Ser 1.61  0.50 - 1.10 (mg/dL)    Calcium 09.6 (*) 8.4 - 10.5 (mg/dL)    GFR calc non Af Amer 65 (*) >90 (mL/min)    GFR calc Af Amer 75 (*) >90 (mL/min)   POCT I-STAT TROPONIN I     Status: Normal   Collection Time   01/22/12  9:16 AM      Component Value Range Comment   Troponin i, poc 0.00  0.00 - 0.08 (ng/mL)    Comment 3             No results found.  ROS: General:No colds or fevers Skin:no rashes, or ulcers HEENT: no blurred vision EA:VWUJW pressure PUL:no SOB GI:no diarrhea, no constipation no melena GU:no hematuria, no dysuria MS:no joint pain Neuro:no syncope Endo:+ diabetes   Blood pressure 121/85, pulse 58, temperature 98.1 F (36.7 C), temperature source Oral,  resp. rate 20, SpO2 100.00%. PE: General: alert and oriented pleasant affect Skin:warm and dry, brisk capillary refill HEENT:normocephalic, sclera clear Neck:no JVD Heart:S1S2 rrr Lungs:clear Abd:+ BS, soft non tender Ext:no edema. pulses Neuro:A&O X 3, MAE, follows commands    Assessment/Plan Patient Active Problem List  Diagnoses  . Chest pressure, resolved  . DM (diabetes mellitus)  . Hypokalemia   PLAN:  No further chest pressure.  EKG changes most likely due to lead placement. Continues hypokalemic, will replace K+, and change her valsartan to 320/ 12.5.  Ok to D/C home our office will call for stress myoview.  And followup.  INGOLD,LAURA R 01/22/2012, 1:14 PM  I have seen and examined the patient along with [facility] R NP.  I have reviewed the chart, notes and new data.  I agree with NP's note.  Key new complaints: recurrent chest pain over last 3-4 days, now resolved, atypical/nonexertional Key examination changes: no abnormalities on cardiovascular exam Key new findings / data: hypokalemia and hypercalcemia may be due to thiazide diuretic and may help explain abnormal ECG (very mild T wave inversion V4-V6). ECG changes are not dynamic. The ECG performed in 2010 has poor lead placement (note almost no R/S transition from V3-V6). Therefore V6 on 2010 ECG corresponds to V3-V4 on ECGs from this year. Similar T wave changes are seen. No dynamic change between last 2 ECGs.  PLAN: 1. Outpatient nuclear stress test: atypical CP in a patient with intermediate risk factors (DM, HLP, HTN - all treated in a relatively young female non-smoker patient). 2. Reduce HCTZ dose and compensate by increasing valsartan dose.  Thurmon Fair, MD, [facility] [phone] 01/22/2012, 2:05 PM

## 2012-01-22 NOTE — ED Provider Notes (Signed)
History     CSN: [id number]  Arrival date & time 01/22/12  0805   First MD Initiated Contact with Patient 01/22/12 0827      Chief Complaint  Patient presents with  . Abnormal ECG    Done 2/28 at [facility].    (Consider location/radiation/quality/duration/timing/severity/associated sxs/prior treatment) HPI  56 year old female with history of diabetes, pacemaker placement from prior syncope in 2005, and hypertension presenting to the ED with chief complaints of chest pressure. Patient states for the past 3 days she has been experiencing constant substernal chest pressure more noticeable when sitting still. Pressure sensation radiates to the back. Exertion, and eating does not worsen the symptoms. She denies shortness of breath, nausea, diaphoresis. She denies fever, headache, abdominal pain, or dysuria. She was first seen 3 days ago at  [facility] for same complaint. At which time an EKG were performed which shows some nonspecific T-wave abnormalities. She was recommended to be admitted for further evaluation however patient decides to leave AMA. Patient did call her cardiologist on the same day and was recommended to return to the ED. Patient states the symptoms persist for the past few days so she decides to come to the ED today after getting off of her 3rd shift.  Past Medical History  Diagnosis Date  . Diabetes mellitus   . Hypertension     Past Surgical History  Procedure Date  . Pacemaker insertion   . Abdominal hysterectomy   . Hernia repair     History reviewed. No pertinent family history.  History  Substance Use Topics  . Smoking status: Never Smoker   . Smokeless tobacco: Not on file  . Alcohol Use: No    OB History    Grav Para Term Preterm Abortions TAB SAB Ect Mult Living                  Review of Systems  All other systems reviewed and are negative.    Allergies  Review of patient's allergies indicates no known  allergies.  Home Medications   Current Outpatient Rx  Name Route Sig Dispense Refill  . AMLODIPINE-ATORVASTATIN 10-20 MG PO TABS Oral Take 1 tablet by mouth daily.    . ASPIRIN 81 MG PO TABS Oral Take 81 mg by mouth daily.    Marland Kitchen METFORMIN HCL 500 MG PO TABS Oral Take 500 mg by mouth daily.     . NEBIVOLOL HCL 10 MG PO TABS Oral Take 10 mg by mouth daily.    Marland Kitchen SITAGLIPTIN-METFORMIN HCL 50-1000 MG PO TABS Oral Take 1 tablet by mouth daily with lunch.    . VALSARTAN-HYDROCHLOROTHIAZIDE 160-25 MG PO TABS Oral Take 1 tablet by mouth daily.      BP 133/78  Pulse 73  Temp(Src) 98.1 F (36.7 C) (Oral)  Resp 18  SpO2 99%  Physical Exam  Nursing note and vitals reviewed. Constitutional: She appears well-developed and well-nourished. No distress.       Awake, alert, nontoxic appearance  HENT:  Head: Atraumatic.  Eyes: Conjunctivae are normal. Right eye exhibits no discharge. Left eye exhibits no discharge.  Neck: Neck supple.  Cardiovascular: Normal rate and regular rhythm.   Pulmonary/Chest: Effort normal. No respiratory distress. She exhibits no tenderness.  Abdominal: Soft. There is no tenderness. There is no rebound.  Musculoskeletal: She exhibits no tenderness.       ROM appears intact, no obvious focal weakness  Neurological: She is alert.  Mental status and motor strength appears intact  Skin: Skin is warm. No rash noted.  Psychiatric: She has a normal mood and affect.    ED Course  Procedures (including critical care time)  Labs Reviewed - No data to display No results found.   No diagnosis found.   Date: 01/22/2012  Rate: 69  Rhythm: normal sinus rhythm  QRS Axis: normal  Intervals: normal  ST/T Wave abnormalities: nonspecific T wave changes  Conduction Disutrbances:none  Narrative Interpretation:   Old EKG Reviewed: unchanged  Results for orders placed during the hospital encounter of 01/22/12  CBC      Component Value Range   WBC 6.0  4.0 - 10.5  (K/uL)   RBC 5.34 (*) 3.87 - 5.11 (MIL/uL)   Hemoglobin 14.7  12.0 - 15.0 (g/dL)   HCT 16.1  09.6 - 04.5 (%)   MCV 82.2  78.0 - 100.0 (fL)   MCH 27.5  26.0 - 34.0 (pg)   MCHC 33.5  30.0 - 36.0 (g/dL)   RDW 40.9  81.1 - 91.4 (%)   Platelets 262  150 - 400 (K/uL)  DIFFERENTIAL      Component Value Range   Neutrophils Relative 53  43 - 77 (%)   Neutro Abs 3.2  1.7 - 7.7 (K/uL)   Lymphocytes Relative 40  12 - 46 (%)   Lymphs Abs 2.4  0.7 - 4.0 (K/uL)   Monocytes Relative 5  3 - 12 (%)   Monocytes Absolute 0.3  0.1 - 1.0 (K/uL)   Eosinophils Relative 1  0 - 5 (%)   Eosinophils Absolute 0.1  0.0 - 0.7 (K/uL)   Basophils Relative 0  0 - 1 (%)   Basophils Absolute 0.0  0.0 - 0.1 (K/uL)  BASIC METABOLIC PANEL      Component Value Range   Sodium 139  135 - 145 (mEq/L)   Potassium 3.4 (*) 3.5 - 5.1 (mEq/L)   Chloride 102  96 - 112 (mEq/L)   CO2 25  19 - 32 (mEq/L)   Glucose, Bld 174 (*) 70 - 99 (mg/dL)   BUN 18  6 - 23 (mg/dL)   Creatinine, Ser 7.82  0.50 - 1.10 (mg/dL)   Calcium 95.6 (*) 8.4 - 10.5 (mg/dL)   GFR calc non Af Amer 65 (*) >90 (mL/min)   GFR calc Af Amer 75 (*) >90 (mL/min)  POCT I-STAT TROPONIN I      Component Value Range   Troponin i, poc 0.00  0.00 - 0.08 (ng/mL)   Comment 3            Dg Chest 2 View  01/20/2012  *RADIOLOGY REPORT*  Clinical Data: Chest pain and tightness.  CHEST - 2 VIEW  Comparison: Chest radiograph performed 12/29/2008  Findings: The lungs are mildly hypoexpanded but appear grossly clear.  There is no evidence of focal opacification, pleural effusion or pneumothorax.  The heart is borderline enlarged.  A pacemaker is noted at the left chest wall, with leads ending at the right atrium and right ventricle.  No acute osseous abnormalities are seen.  IMPRESSION: Lungs mildly hypoexpanded but grossly clear.  Borderline cardiomegaly seen.  Original Report Authenticated By: Tonia Ghent, M.D.      MDM  Constant chest pressure for the past 3 days. EKG  shows nonspecific T wave changes in lead 3 and aVF, unchanged from prior. Patient did receive 3 baby aspirin on 01/20/12, but had not taken any ASA today. She is currently  in no acute distress. She is a TIMI 1. She is PERC negative.  CXR on 01/20/12 was unremarkable.  Discussed care with my attending. Cardiac workup initiated. We'll contact cardiologist from [place].    10:01 AM Calcium mildly elevated at 10.8.  It was 10.7 two days ago. Some evidence of dehydration based on the decreased GFR. Her first set of  troponin is negative today.  12:26 PM [facility] will see pt in ED.  Pt currently pain free and in no acute distress  Fayrene Helper, PA-C 01/23/12 0740

## 2012-01-22 NOTE — ED Notes (Signed)
MD at bedside. 

## 2012-01-24 NOTE — ED Provider Notes (Signed)
Medical screening examination/treatment/procedure(s) were conducted as a shared visit with non-physician practitioner(s) and myself.  I personally evaluated the patient during the encounter  Flint Melter, MD 01/24/12 [phone]

## 2012-07-27 ENCOUNTER — Encounter (HOSPITAL_COMMUNITY): Payer: Self-pay | Admitting: Emergency Medicine

## 2012-07-27 DIAGNOSIS — Z950 Presence of cardiac pacemaker: Secondary | ICD-10-CM | POA: Insufficient documentation

## 2012-07-27 DIAGNOSIS — E119 Type 2 diabetes mellitus without complications: Secondary | ICD-10-CM | POA: Insufficient documentation

## 2012-07-27 DIAGNOSIS — I10 Essential (primary) hypertension: Secondary | ICD-10-CM | POA: Insufficient documentation

## 2012-07-27 DIAGNOSIS — N390 Urinary tract infection, site not specified: Secondary | ICD-10-CM | POA: Insufficient documentation

## 2012-07-27 DIAGNOSIS — Z8249 Family history of ischemic heart disease and other diseases of the circulatory system: Secondary | ICD-10-CM | POA: Insufficient documentation

## 2012-07-27 LAB — CBC WITH DIFFERENTIAL/PLATELET
Eosinophils Absolute: 0 10*3/uL (ref 0.0–0.7)
Eosinophils Relative: 1 % (ref 0–5)
HCT: 43.6 % (ref 36.0–46.0)
Hemoglobin: 15.4 g/dL — ABNORMAL HIGH (ref 12.0–15.0)
Lymphocytes Relative: 37 % (ref 12–46)
Lymphs Abs: 2.7 10*3/uL (ref 0.7–4.0)
MCH: 28.1 pg (ref 26.0–34.0)
MCV: 79.6 fL (ref 78.0–100.0)
Monocytes Relative: 6 % (ref 3–12)
RBC: 5.48 MIL/uL — ABNORMAL HIGH (ref 3.87–5.11)
WBC: 7.1 10*3/uL (ref 4.0–10.5)

## 2012-07-27 LAB — GLUCOSE, CAPILLARY: Glucose-Capillary: 387 mg/dL — ABNORMAL HIGH (ref 70–99)

## 2012-07-27 NOTE — ED Notes (Signed)
Went to PCP today, and received insulin shot, and was given lantus; reports that she doesn't feel right; checked sugar at home and was 353, had some nausea and vomiting as well

## 2012-07-28 ENCOUNTER — Emergency Department (HOSPITAL_COMMUNITY)
Admission: EM | Admit: 2012-07-28 | Discharge: 2012-07-28 | Disposition: A | Discharge status: Home / Self Care | Payer: 59 | Attending: Emergency Medicine | Admitting: Emergency Medicine

## 2012-07-28 DIAGNOSIS — R739 Hyperglycemia, unspecified: Secondary | ICD-10-CM

## 2012-07-28 DIAGNOSIS — N390 Urinary tract infection, site not specified: Secondary | ICD-10-CM

## 2012-07-28 DIAGNOSIS — E119 Type 2 diabetes mellitus without complications: Secondary | ICD-10-CM

## 2012-07-28 LAB — COMPREHENSIVE METABOLIC PANEL
ALT: 27 U/L (ref 0–35)
Alkaline Phosphatase: 144 U/L — ABNORMAL HIGH (ref 39–117)
BUN: 18 mg/dL (ref 6–23)
CO2: 22 mEq/L (ref 19–32)
Calcium: 10.8 mg/dL — ABNORMAL HIGH (ref 8.4–10.5)
GFR calc Af Amer: 83 mL/min — ABNORMAL LOW (ref >90)
GFR calc non Af Amer: 71 mL/min — ABNORMAL LOW (ref >90)
Glucose, Bld: 413 mg/dL — ABNORMAL HIGH (ref 70–99)
Potassium: 3.9 mEq/L (ref 3.5–5.1)
Sodium: 135 mEq/L (ref 135–145)
Total Protein: 7.8 g/dL (ref 6.0–8.3)

## 2012-07-28 LAB — URINALYSIS, ROUTINE W REFLEX MICROSCOPIC
Bilirubin Urine: NEGATIVE
Glucose, UA: NEGATIVE mg/dL
Specific Gravity, Urine: 1.025 (ref 1.005–1.030)
pH: 6 (ref 5.0–8.0)

## 2012-07-28 LAB — GLUCOSE, CAPILLARY: Glucose-Capillary: 335 mg/dL — ABNORMAL HIGH (ref 70–99)

## 2012-07-28 LAB — URINE MICROSCOPIC-ADD ON

## 2012-07-28 MED ORDER — CIPROFLOXACIN HCL 500 MG PO TABS: 500.0000 mg | ORAL_TABLET | Freq: Two times a day (BID) | ORAL | Status: AC

## 2012-07-28 MED ORDER — ONDANSETRON HCL 4 MG/2ML IJ SOLN
4.0000 mg | Freq: Once | INTRAMUSCULAR | Status: AC
  Administered 2012-07-28: 4 mg via INTRAVENOUS
  Filled 2012-07-28: qty 2

## 2012-07-28 MED ORDER — INSULIN ASPART 100 UNIT/ML ~~LOC~~ SOLN
8.0000 [IU] | Freq: Once | SUBCUTANEOUS | Status: AC
  Administered 2012-07-28: 8 [IU] via SUBCUTANEOUS
  Filled 2012-07-28: qty 1

## 2012-07-28 MED ORDER — SODIUM CHLORIDE 0.9 % IV SOLN
1000.0000 mL | Freq: Once | INTRAVENOUS | Status: AC
  Administered 2012-07-28: 1000 mL via INTRAVENOUS

## 2012-07-28 MED ORDER — CIPROFLOXACIN IN D5W 400 MG/200ML IV SOLN
400.0000 mg | Freq: Once | INTRAVENOUS | Status: AC
  Administered 2012-07-28: 400 mg via INTRAVENOUS
  Filled 2012-07-28: qty 200

## 2012-07-28 MED ORDER — SODIUM CHLORIDE 0.9 % IV SOLN
1000.0000 mL | INTRAVENOUS | Status: DC
  Administered 2012-07-28: 1000 mL via INTRAVENOUS

## 2012-07-28 MED ORDER — INSULIN ASPART 100 UNIT/ML ~~LOC~~ SOLN
12.0000 [IU] | Freq: Once | SUBCUTANEOUS | Status: AC
  Administered 2012-07-28: 12 [IU] via INTRAVENOUS
  Filled 2012-07-28: qty 1

## 2012-07-28 NOTE — ED Notes (Signed)
Pt denies any nausea.  States she feels "great" and is ready go home.

## 2012-07-28 NOTE — ED Provider Notes (Signed)
History     CSN: [id number]  Arrival date & time 07/27/12  2315   First MD Initiated Contact with Patient 07/28/12 0126      Chief Complaint  Patient presents with  . Hyperglycemia    (Consider location/radiation/quality/duration/timing/severity/associated sxs/prior treatment) HPI 56 year old female presents to emergency room with report of high blood sugar. She reports she's been having high blood sugars for the past week. She's been seen by her doctor twice in the office. At the first office visit, patient was given short acting insulin. At second visit, patient started on Lantus. She is supposed to start her first Lantus dose later this morning. Overnight, patient felt "awful". She decided to come to the emergency department instead of waiting to give her self her morning Lantus. She denies any chest pain, shortness of breath, abdominal pain, nausea, vomiting, fever, abscesses, or rash. Patient has history of type 2 diabetes, is on metformin and januet. She has not prior required insulin to control her diabetes. Past Medical History  Diagnosis Date  . Diabetes mellitus   . Hypertension   . Chest pressure, resolved 01/22/2012  . DM (diabetes mellitus) 01/22/2012  . Hypokalemia 01/22/2012  . Dysrhythmia   . Pacemaker     Past Surgical History  Procedure Date  . Pacemaker insertion   . Abdominal hysterectomy   . Hernia repair   . Insert / replace / remove pacemaker     Family History  Problem Relation Age of Onset  . Coronary artery disease Father     History  Substance Use Topics  . Smoking status: Never Smoker   . Smokeless tobacco: Never Used  . Alcohol Use: No    OB History    Grav Para Term Preterm Abortions TAB SAB Ect Mult Living                  Review of Systems  All other systems reviewed and are negative.    Allergies  Review of patient's allergies indicates no known allergies.  Home Medications   Current Outpatient Rx  Name Route Sig Dispense  Refill  . AMLODIPINE-ATORVASTATIN 10-20 MG PO TABS Oral Take 1 tablet by mouth daily.    Marland Kitchen METFORMIN HCL 500 MG PO TABS Oral Take 500 mg by mouth daily.     Marland Kitchen METFORMIN HCL 500 MG PO TABS Oral Take 500 mg by mouth 2 (two) times daily with a meal.    . NEBIVOLOL HCL 10 MG PO TABS Oral Take 10 mg by mouth daily.    Marland Kitchen VALSARTAN-HYDROCHLOROTHIAZIDE 160-25 MG PO TABS Oral Take 1 tablet by mouth daily.    Marland Kitchen CIPROFLOXACIN HCL 500 MG PO TABS Oral Take 1 tablet (500 mg total) by mouth every 12 (twelve) hours. 10 tablet 0    BP 134/79  Pulse 75  Temp 97.8 F (36.6 C) (Oral)  Resp 18  SpO2 100%  Physical Exam  Nursing note and vitals reviewed. Constitutional: She is oriented to person, place, and time. She appears well-developed and well-nourished.  HENT:  Head: Normocephalic and atraumatic.  Nose: Nose normal.  Mouth/Throat: Oropharynx is clear and moist.  Eyes: Conjunctivae and EOM are normal. Pupils are equal, round, and reactive to light.  Neck: Normal range of motion. Neck supple. No JVD present. No tracheal deviation present. No thyromegaly present.  Cardiovascular: Normal rate, regular rhythm, normal heart sounds and intact distal pulses.  Exam reveals no gallop and no friction rub.   No murmur heard. Pulmonary/Chest: Effort normal  and breath sounds normal. No stridor. No respiratory distress. She has no wheezes. She has no rales. She exhibits no tenderness.  Abdominal: Soft. Bowel sounds are normal. She exhibits no distension and no mass. There is no tenderness. There is no rebound and no guarding.  Musculoskeletal: Normal range of motion. She exhibits no edema and no tenderness.  Lymphadenopathy:    She has no cervical adenopathy.  Neurological: She is alert and oriented to person, place, and time. She has normal reflexes. She exhibits normal muscle tone. Coordination normal.  Skin: Skin is dry. No rash noted. No erythema. No pallor.  Psychiatric: She has a normal mood and affect. Her  behavior is normal. Judgment and thought content normal.    ED Course  Procedures (including critical care time)  Labs Reviewed  URINALYSIS, ROUTINE W REFLEX MICROSCOPIC - Abnormal; Notable for the following:    APPearance CLOUDY (*)     Hgb urine dipstick TRACE (*)     Ketones, ur 15 (*)     Leukocytes, UA TRACE (*)     All other components within normal limits  CBC WITH DIFFERENTIAL - Abnormal; Notable for the following:    RBC 5.48 (*)     Hemoglobin 15.4 (*)     All other components within normal limits  COMPREHENSIVE METABOLIC PANEL - Abnormal; Notable for the following:    Glucose, Bld 413 (*)     Calcium 10.8 (*)     Alkaline Phosphatase 144 (*)     GFR calc non Af Amer 71 (*)     GFR calc Af Amer 83 (*)     All other components within normal limits  GLUCOSE, CAPILLARY - Abnormal; Notable for the following:    Glucose-Capillary 387 (*)     All other components within normal limits  URINE MICROSCOPIC-ADD ON - Abnormal; Notable for the following:    Bacteria, UA FEW (*)     All other components within normal limits  GLUCOSE, CAPILLARY - Abnormal; Notable for the following:    Glucose-Capillary 335 (*)     All other components within normal limits  GLUCOSE, CAPILLARY - Abnormal; Notable for the following:    Glucose-Capillary 373 (*)     All other components within normal limits  GLUCOSE, CAPILLARY - Abnormal; Notable for the following:    Glucose-Capillary 264 (*)     All other components within normal limits  URINE CULTURE   No results found.   1. DM (diabetes mellitus)   2. Hyperglycemia   3. UTI (lower urinary tract infection)       MDM  56 year old female with hyperglycemia. She is noted to have 7-10 white cells with rare epithelials and trace leukocyte Estrace. Possible cause for her hyperglycemia would be a urinary tract infection. Will treat for same. Patient has been given IV fluids and insulin. She is feeling much better. She has close followup with  her primary care Dr. Geronimo Running been given precautions for return        Olivia Mackie, MD 07/28/12 [phone]

## 2012-07-28 NOTE — ED Notes (Signed)
Pt to ED c/o nausea and hyperglycemia.  She felt thirsty, nauseated and "bad".  Was seen at her pcp today and was given fast and slow acting insulin (this was the first time she had ever been given insulin per pt).  She states she felt better after receiving insulin, but not well enough.  Emesis x 2 today. Pt denies any pain.

## 2012-07-29 LAB — URINE CULTURE

## 2012-08-14 ENCOUNTER — Ambulatory Visit: Payer: 59 | Admitting: Family Medicine

## 2012-09-25 ENCOUNTER — Other Ambulatory Visit: Payer: Self-pay | Admitting: Family Medicine

## 2012-09-25 DIAGNOSIS — Z1231 Encounter for screening mammogram for malignant neoplasm of breast: Secondary | ICD-10-CM

## 2012-11-08 ENCOUNTER — Ambulatory Visit
Admission: RE | Admit: 2012-11-08 | Discharge: 2012-11-08 | Disposition: A | Discharge status: Home / Self Care | Payer: 59 | Source: Ambulatory Visit | Attending: Family Medicine | Admitting: Family Medicine

## 2012-11-08 DIAGNOSIS — Z1231 Encounter for screening mammogram for malignant neoplasm of breast: Secondary | ICD-10-CM

## 2012-11-21 ENCOUNTER — Emergency Department (HOSPITAL_COMMUNITY): Payer: 59

## 2012-11-21 ENCOUNTER — Encounter (HOSPITAL_COMMUNITY): Payer: Self-pay | Admitting: *Deleted

## 2012-11-21 ENCOUNTER — Emergency Department (HOSPITAL_COMMUNITY)
Admission: EM | Admit: 2012-11-21 | Discharge: 2012-11-21 | Disposition: A | Discharge status: Home / Self Care | Payer: 59 | Attending: Emergency Medicine | Admitting: Emergency Medicine

## 2012-11-21 DIAGNOSIS — Z8679 Personal history of other diseases of the circulatory system: Secondary | ICD-10-CM | POA: Insufficient documentation

## 2012-11-21 DIAGNOSIS — I10 Essential (primary) hypertension: Secondary | ICD-10-CM | POA: Insufficient documentation

## 2012-11-21 DIAGNOSIS — R079 Chest pain, unspecified: Secondary | ICD-10-CM

## 2012-11-21 DIAGNOSIS — Z79899 Other long term (current) drug therapy: Secondary | ICD-10-CM | POA: Insufficient documentation

## 2012-11-21 DIAGNOSIS — E119 Type 2 diabetes mellitus without complications: Secondary | ICD-10-CM | POA: Insufficient documentation

## 2012-11-21 DIAGNOSIS — Z950 Presence of cardiac pacemaker: Secondary | ICD-10-CM | POA: Insufficient documentation

## 2012-11-21 LAB — CBC
HCT: 39.6 % (ref 36.0–46.0)
Hemoglobin: 13.3 g/dL (ref 12.0–15.0)
MCH: 27.2 pg (ref 26.0–34.0)
MCHC: 33.6 g/dL (ref 30.0–36.0)
MCV: 81 fL (ref 78.0–100.0)
RBC: 4.89 MIL/uL (ref 3.87–5.11)

## 2012-11-21 LAB — BASIC METABOLIC PANEL
BUN: 21 mg/dL (ref 6–23)
CO2: 23 mEq/L (ref 19–32)
Calcium: 10 mg/dL (ref 8.4–10.5)
Creatinine, Ser: 0.99 mg/dL (ref 0.50–1.10)
GFR calc non Af Amer: 63 mL/min — ABNORMAL LOW (ref >90)
Glucose, Bld: 149 mg/dL — ABNORMAL HIGH (ref 70–99)

## 2012-11-21 MED ORDER — ONDANSETRON 8 MG PO TBDP
8.0000 mg | ORAL_TABLET | Freq: Once | ORAL | Status: AC
  Administered 2012-11-21: 8 mg via ORAL
  Filled 2012-11-21: qty 1

## 2012-11-21 MED ORDER — ONDANSETRON 8 MG PO TBDP: 8.0000 mg | ORAL_TABLET | Freq: Three times a day (TID) | ORAL | Status: DC | PRN

## 2012-11-21 NOTE — ED Provider Notes (Signed)
History     CSN: [id number]  Arrival date & time 11/21/12  1612   First MD Initiated Contact with Patient 11/21/12 1725      Chief Complaint  Patient presents with  . Chest Pain    The history is provided by the patient.   the patient reports some sharp tingling and stinging pain that occurred in the left side of her chest.  She been having nausea vomiting and diarrhea today.  The tingling was transient and has since resolved.  She has no shortness of breath.  She denies fevers or chills.  She reports 2 episodes of diarrhea in the emergency department.  No melena or hematochezia.  She has a pacemaker and she was concerned maybe there is an issue with her pacemaker given the tingling in her chest.  The tingling is has resolved.  She has a history of pacemaker insertion but no history of coronary artery disease per the patient.  No complaints at this time.  Past Medical History  Diagnosis Date  . Diabetes mellitus   . Hypertension   . Chest pressure, resolved 01/22/2012  . DM (diabetes mellitus) 01/22/2012  . Hypokalemia 01/22/2012  . Dysrhythmia   . Pacemaker     Past Surgical History  Procedure Date  . Pacemaker insertion   . Abdominal hysterectomy   . Hernia repair   . Insert / replace / remove pacemaker     Family History  Problem Relation Age of Onset  . Coronary artery disease Father     History  Substance Use Topics  . Smoking status: Never Smoker   . Smokeless tobacco: Never Used  . Alcohol Use: No    OB History    Grav Para Term Preterm Abortions TAB SAB Ect Mult Living                  Review of Systems  All other systems reviewed and are negative.    Allergies  Review of patient's allergies indicates no known allergies.  Home Medications   Current Outpatient Rx  Name  Route  Sig  Dispense  Refill  . AMLODIPINE-ATORVASTATIN 10-20 MG PO TABS   Oral   Take 1 tablet by mouth at bedtime.          . NEBIVOLOL HCL 10 MG PO TABS   Oral   Take 10 mg  by mouth at bedtime.          Marland Kitchen SITAGLIPTIN-METFORMIN HCL 50-1000 MG PO TABS   Oral   Take 1 tablet by mouth 2 (two) times daily with a meal.         . VALSARTAN-HYDROCHLOROTHIAZIDE 160-25 MG PO TABS   Oral   Take 1 tablet by mouth at bedtime.            BP 136/92  Pulse 79  Temp 97.8 F (36.6 C) (Oral)  Resp 16  SpO2 96%  Physical Exam  Nursing note and vitals reviewed. Constitutional: She is oriented to person, place, and time. She appears well-developed and well-nourished. No distress.  HENT:  Head: Normocephalic and atraumatic.  Eyes: EOM are normal.  Neck: Normal range of motion.  Cardiovascular: Normal rate, regular rhythm and normal heart sounds.   Pulmonary/Chest: Effort normal and breath sounds normal.  Abdominal: Soft. She exhibits no distension. There is no tenderness.  Musculoskeletal: Normal range of motion.  Neurological: She is alert and oriented to person, place, and time.  Skin: Skin is warm and dry.  Psychiatric:  She has a normal mood and affect. Judgment normal.    ED Course  Procedures (including critical care time)   Date: 11/21/2012  Rate: 89  Rhythm: normal sinus rhythm  QRS Axis: normal  Intervals: normal  ST/T Wave abnormalities: normal  Conduction Disutrbances: none  Narrative Interpretation:   Old EKG Reviewed: No significant changes noted     Labs Reviewed  BASIC METABOLIC PANEL - Abnormal; Notable for the following:    Glucose, Bld 149 (*)     GFR calc non Af Amer 63 (*)     GFR calc Af Amer 74 (*)     All other components within normal limits  CBC  TROPONIN I   Dg Chest 2 View  11/21/2012  *RADIOLOGY REPORT*  Clinical Data: Chest pain.  Cough.  Weakness.  CHEST - 2 VIEW  Comparison: 01/20/2012  Findings: Heart size is normal.  Both lungs are clear.  No evidence of pleural effusion.  Dual lead transvenous pacemaker remains in appropriate position.  IMPRESSION: Stable exam.  No active disease.   Original Report  Authenticated By: Myles Rosenthal, M.D.    I personally reviewed the imaging tests through PACS system I reviewed available ER/hospitalization records through the EMR   1. Chest pain       MDM  The patient is well-appearing.  She had some sense of tingling in the left side of her chest.  This has resolved.  This is not ACS.  I doubt this is an issue with her pacemaker.  She has a normal sinus rhythm at this time.  She has a demand pacer lead.  She has had nausea vomiting and diarrhea.  This is likely viral illness.  Home with antinausea medicine.  No fevers or chills.  No myalgias.       Lyanne Co, MD 11/22/12 [phone]

## 2012-11-21 NOTE — ED Notes (Addendum)
Pt has pacemaker. Pt reports chest pain, sharp stinging pain 4/10 started at 1500. Vomited x2 today. Diarrhea. Denies SOB  Reports she passed out last Thursday. At present denies dizziness or weakness.

## 2012-11-22 LAB — HM DIABETES EYE EXAM

## 2013-02-21 ENCOUNTER — Emergency Department (HOSPITAL_BASED_OUTPATIENT_CLINIC_OR_DEPARTMENT_OTHER)
Admission: EM | Admit: 2013-02-21 | Discharge: 2013-02-22 | Disposition: A | Discharge status: Home / Self Care | Payer: 59 | Attending: Emergency Medicine | Admitting: Emergency Medicine

## 2013-02-21 ENCOUNTER — Encounter (HOSPITAL_BASED_OUTPATIENT_CLINIC_OR_DEPARTMENT_OTHER): Payer: Self-pay | Admitting: *Deleted

## 2013-02-21 DIAGNOSIS — R002 Palpitations: Secondary | ICD-10-CM | POA: Insufficient documentation

## 2013-02-21 DIAGNOSIS — E876 Hypokalemia: Secondary | ICD-10-CM | POA: Insufficient documentation

## 2013-02-21 DIAGNOSIS — R1013 Epigastric pain: Secondary | ICD-10-CM | POA: Insufficient documentation

## 2013-02-21 DIAGNOSIS — Z79899 Other long term (current) drug therapy: Secondary | ICD-10-CM | POA: Insufficient documentation

## 2013-02-21 DIAGNOSIS — I498 Other specified cardiac arrhythmias: Secondary | ICD-10-CM | POA: Insufficient documentation

## 2013-02-21 DIAGNOSIS — Z950 Presence of cardiac pacemaker: Secondary | ICD-10-CM | POA: Insufficient documentation

## 2013-02-21 DIAGNOSIS — R42 Dizziness and giddiness: Secondary | ICD-10-CM | POA: Insufficient documentation

## 2013-02-21 DIAGNOSIS — E119 Type 2 diabetes mellitus without complications: Secondary | ICD-10-CM | POA: Insufficient documentation

## 2013-02-21 DIAGNOSIS — I10 Essential (primary) hypertension: Secondary | ICD-10-CM | POA: Insufficient documentation

## 2013-02-21 DIAGNOSIS — R112 Nausea with vomiting, unspecified: Secondary | ICD-10-CM | POA: Insufficient documentation

## 2013-02-21 LAB — CBC WITH DIFFERENTIAL/PLATELET
HCT: 41.6 % (ref 36.0–46.0)
Hemoglobin: 14.4 g/dL (ref 12.0–15.0)
Lymphocytes Relative: 32 % (ref 12–46)
Lymphs Abs: 2.7 10*3/uL (ref 0.7–4.0)
MCHC: 34.6 g/dL (ref 30.0–36.0)
Monocytes Absolute: 0.6 10*3/uL (ref 0.1–1.0)
Monocytes Relative: 7 % (ref 3–12)
Neutro Abs: 5 10*3/uL (ref 1.7–7.7)
Neutrophils Relative %: 60 % (ref 43–77)
RBC: 5.24 MIL/uL — ABNORMAL HIGH (ref 3.87–5.11)
WBC: 8.4 10*3/uL (ref 4.0–10.5)

## 2013-02-21 NOTE — ED Provider Notes (Addendum)
History     CSN: [id number]  Arrival date & time 02/21/13  2309   None     Chief Complaint  Patient presents with  . Palpitations    (Consider location/radiation/quality/duration/timing/severity/associated sxs/prior treatment) HPI This is a 57 year old female who just got over a cold that she had had the past week with associated diarrhea that has persisted. She also has a history of demand pacemaker placed for severe bradycardia. She went to work this evening. When she got to work she had the sudden onset of palpitations by which she means a rapid heartbeat. This was subsequently joined by lightheadedness and nausea. She vomited a total of 4 times which significantly improved her symptoms. She is only having some mild nausea at this time and the palpitations have resolved. She did not have any chest pain, dyspnea or diaphoresis with this. She is having some mild epigastric pain as a result of vomiting.  Past Medical History  Diagnosis Date  . Diabetes mellitus   . Hypertension   . Chest pressure, resolved 01/22/2012  . DM (diabetes mellitus) 01/22/2012  . Hypokalemia 01/22/2012  . Dysrhythmia   . Pacemaker     Past Surgical History  Procedure Laterality Date  . Pacemaker insertion    . Abdominal hysterectomy    . Hernia repair    . Insert / replace / remove pacemaker      Family History  Problem Relation Age of Onset  . Coronary artery disease Father     History  Substance Use Topics  . Smoking status: Never Smoker   . Smokeless tobacco: Never Used  . Alcohol Use: No    OB History   Grav Para Term Preterm Abortions TAB SAB Ect Mult Living                  Review of Systems  All other systems reviewed and are negative.    Allergies  Review of patient's allergies indicates no known allergies.  Home Medications   Current Outpatient Rx  Name  Route  Sig  Dispense  Refill  . amlodipine-atorvastatin (CADUET) 10-20 MG per tablet   Oral   Take 1 tablet by mouth  at bedtime.          . nebivolol (BYSTOLIC) 10 MG tablet   Oral   Take 10 mg by mouth at bedtime.          . ondansetron (ZOFRAN ODT) 8 MG disintegrating tablet   Oral   Take 1 tablet (8 mg total) by mouth every 8 (eight) hours as needed for nausea.   12 tablet   0   . sitaGLIPtan-metformin (JANUMET) 50-1000 MG per tablet   Oral   Take 1 tablet by mouth 2 (two) times daily with a meal.         . valsartan-hydrochlorothiazide (DIOVAN-HCT) 160-25 MG per tablet   Oral   Take 1 tablet by mouth at bedtime.            BP 140/93  Pulse 86  Temp(Src) 98 F (36.7 C)  Resp 20  Ht 5\' 6"  (1.676 m)  Wt 240 lb (108.863 kg)  BMI 38.76 kg/m2  SpO2 95%  Physical Exam General: Well-developed, well-nourished female in no acute distress; appearance consistent with age of record HENT: normocephalic, atraumatic Eyes: pupils equal round and reactive to light; extraocular muscles intact Neck: supple Heart: regular rate and rhythm; no murmurs, rubs or gallops Lungs: clear to auscultation bilaterally Abdomen: soft; nondistended; mild epigastric  tenderness; bowel sounds present Extremities: No deformity; full range of motion; pulses normal Neurologic: Awake, alert and oriented; motor function intact in all extremities and symmetric; no facial droop Skin: Warm and dry Psychiatric: Normal mood and affect    ED Course  Procedures (including critical care time)    MDM   Nursing notes and vitals signs, including pulse oximetry, reviewed.  Summary of this visit's results, reviewed by myself:  Labs:  Results for orders placed during the hospital encounter of 02/21/13 (from the past 24 hour(s))  TROPONIN I     Status: None   Collection Time    02/21/13 11:32 PM      Result Value Range   Troponin I <0.30  <0.30 ng/mL  CBC WITH DIFFERENTIAL     Status: Abnormal   Collection Time    02/21/13 11:32 PM      Result Value Range   WBC 8.4  4.0 - 10.5 K/uL   RBC 5.24 (*) 3.87 - 5.11  MIL/uL   Hemoglobin 14.4  12.0 - 15.0 g/dL   HCT 16.1  09.6 - 04.5 %   MCV 79.4  78.0 - 100.0 fL   MCH 27.5  26.0 - 34.0 pg   MCHC 34.6  30.0 - 36.0 g/dL   RDW 40.9  81.1 - 91.4 %   Platelets 376  150 - 400 K/uL   Neutrophils Relative 60  43 - 77 %   Neutro Abs 5.0  1.7 - 7.7 K/uL   Lymphocytes Relative 32  12 - 46 %   Lymphs Abs 2.7  0.7 - 4.0 K/uL   Monocytes Relative 7  3 - 12 %   Monocytes Absolute 0.6  0.1 - 1.0 K/uL   Eosinophils Relative 1  0 - 5 %   Eosinophils Absolute 0.1  0.0 - 0.7 K/uL   Basophils Relative 0  0 - 1 %   Basophils Absolute 0.0  0.0 - 0.1 K/uL  COMPREHENSIVE METABOLIC PANEL     Status: Abnormal   Collection Time    02/21/13 11:32 PM      Result Value Range   Sodium 135  135 - 145 mEq/L   Potassium 3.0 (*) 3.5 - 5.1 mEq/L   Chloride 97  96 - 112 mEq/L   CO2 21  19 - 32 mEq/L   Glucose, Bld 242 (*) 70 - 99 mg/dL   BUN 14  6 - 23 mg/dL   Creatinine, Ser 7.82  0.50 - 1.10 mg/dL   Calcium 95.6 (*) 8.4 - 10.5 mg/dL   Total Protein 8.6 (*) 6.0 - 8.3 g/dL   Albumin 4.1  3.5 - 5.2 g/dL   AST 19  0 - 37 U/L   ALT 20  0 - 35 U/L   Alkaline Phosphatase 124 (*) 39 - 117 U/L   Total Bilirubin 0.3  0.3 - 1.2 mg/dL   GFR calc non Af Amer 63 (*) >90 mL/min   GFR calc Af Amer 73 (*) >90 mL/min  TROPONIN I     Status: None   Collection Time    02/22/13  1:28 AM      Result Value Range   Troponin I <0.30  <0.30 ng/mL      EKG Interpretation:  Date & Time: 02/21/2013 11:25 PM  Rate: 101  Rhythm: sinus tachycardia  QRS Axis: normal  Intervals: normal  ST/T Wave abnormalities: normal  Conduction Disutrbances:none  Narrative Interpretation: LVH  Old EKG Reviewed: Rate is faster  2:20 AM Patient asymptomatic at this time. She states she obtained significant relief from a 1 L IV fluid bolus. She was also given 40 mEq of K-Dur for hypokalemia. This may be due to her recent diarrhea though it is noted she is also on hydrochlorothiazide. Her rhythm has been  normal sinus throughout her visit.      Hanley Seamen, MD 02/22/13 1610  Hanley Seamen, MD 02/22/13 0221  Carlisle Beers Aubrey Voong, MD 02/22/13 [id number]

## 2013-02-21 NOTE — ED Notes (Signed)
Pt c/o palpitations and n/v x 1 hr

## 2013-02-22 ENCOUNTER — Emergency Department (HOSPITAL_BASED_OUTPATIENT_CLINIC_OR_DEPARTMENT_OTHER)
Admission: EM | Admit: 2013-02-22 | Discharge: 2013-02-22 | Disposition: A | Discharge status: Home / Self Care | Payer: 59 | Attending: Emergency Medicine | Admitting: Emergency Medicine

## 2013-02-22 ENCOUNTER — Encounter (HOSPITAL_BASED_OUTPATIENT_CLINIC_OR_DEPARTMENT_OTHER): Payer: Self-pay | Admitting: Emergency Medicine

## 2013-02-22 DIAGNOSIS — Z9889 Other specified postprocedural states: Secondary | ICD-10-CM | POA: Insufficient documentation

## 2013-02-22 DIAGNOSIS — E119 Type 2 diabetes mellitus without complications: Secondary | ICD-10-CM | POA: Insufficient documentation

## 2013-02-22 DIAGNOSIS — R42 Dizziness and giddiness: Secondary | ICD-10-CM | POA: Insufficient documentation

## 2013-02-22 DIAGNOSIS — Z8679 Personal history of other diseases of the circulatory system: Secondary | ICD-10-CM | POA: Insufficient documentation

## 2013-02-22 DIAGNOSIS — Z950 Presence of cardiac pacemaker: Secondary | ICD-10-CM | POA: Insufficient documentation

## 2013-02-22 DIAGNOSIS — F411 Generalized anxiety disorder: Secondary | ICD-10-CM | POA: Insufficient documentation

## 2013-02-22 DIAGNOSIS — F419 Anxiety disorder, unspecified: Secondary | ICD-10-CM

## 2013-02-22 DIAGNOSIS — R197 Diarrhea, unspecified: Secondary | ICD-10-CM | POA: Insufficient documentation

## 2013-02-22 DIAGNOSIS — Z90710 Acquired absence of both cervix and uterus: Secondary | ICD-10-CM | POA: Insufficient documentation

## 2013-02-22 DIAGNOSIS — Z8639 Personal history of other endocrine, nutritional and metabolic disease: Secondary | ICD-10-CM | POA: Insufficient documentation

## 2013-02-22 DIAGNOSIS — R5381 Other malaise: Secondary | ICD-10-CM | POA: Insufficient documentation

## 2013-02-22 DIAGNOSIS — I10 Essential (primary) hypertension: Secondary | ICD-10-CM | POA: Insufficient documentation

## 2013-02-22 DIAGNOSIS — Z862 Personal history of diseases of the blood and blood-forming organs and certain disorders involving the immune mechanism: Secondary | ICD-10-CM | POA: Insufficient documentation

## 2013-02-22 DIAGNOSIS — R1110 Vomiting, unspecified: Secondary | ICD-10-CM | POA: Insufficient documentation

## 2013-02-22 DIAGNOSIS — Z79899 Other long term (current) drug therapy: Secondary | ICD-10-CM | POA: Insufficient documentation

## 2013-02-22 LAB — COMPREHENSIVE METABOLIC PANEL
AST: 22 U/L (ref 0–37)
Albumin: 3.8 g/dL (ref 3.5–5.2)
Albumin: 4.1 g/dL (ref 3.5–5.2)
Alkaline Phosphatase: 124 U/L — ABNORMAL HIGH (ref 39–117)
BUN: 12 mg/dL (ref 6–23)
BUN: 14 mg/dL (ref 6–23)
CO2: 21 mEq/L (ref 19–32)
Calcium: 10.6 mg/dL — ABNORMAL HIGH (ref 8.4–10.5)
Chloride: 97 mEq/L (ref 96–112)
Creatinine, Ser: 1 mg/dL (ref 0.50–1.10)
Creatinine, Ser: 1 mg/dL (ref 0.50–1.10)
GFR calc non Af Amer: 63 mL/min — ABNORMAL LOW (ref >90)
Glucose, Bld: 242 mg/dL — ABNORMAL HIGH (ref 70–99)
Potassium: 3 mEq/L — ABNORMAL LOW (ref 3.5–5.1)
Total Bilirubin: 0.3 mg/dL (ref 0.3–1.2)
Total Protein: 7.8 g/dL (ref 6.0–8.3)

## 2013-02-22 LAB — URINALYSIS, ROUTINE W REFLEX MICROSCOPIC
Bilirubin Urine: NEGATIVE
Ketones, ur: NEGATIVE mg/dL
Leukocytes, UA: NEGATIVE
Nitrite: NEGATIVE
Protein, ur: NEGATIVE mg/dL
pH: 5 (ref 5.0–8.0)

## 2013-02-22 LAB — CBC WITH DIFFERENTIAL/PLATELET
Basophils Absolute: 0 10*3/uL (ref 0.0–0.1)
Basophils Relative: 0 % (ref 0–1)
Eosinophils Absolute: 0.1 10*3/uL (ref 0.0–0.7)
HCT: 38.7 % (ref 36.0–46.0)
Hemoglobin: 13.4 g/dL (ref 12.0–15.0)
MCH: 27.5 pg (ref 26.0–34.0)
MCHC: 34.6 g/dL (ref 30.0–36.0)
Monocytes Absolute: 0.5 10*3/uL (ref 0.1–1.0)
Monocytes Relative: 6 % (ref 3–12)
RDW: 14 % (ref 11.5–15.5)

## 2013-02-22 LAB — D-DIMER, QUANTITATIVE: D-Dimer, Quant: 0.38 ug/mL-FEU (ref 0.00–0.48)

## 2013-02-22 LAB — TROPONIN I
Troponin I: <0.3 ng/mL (ref <0.30)
Troponin I: <0.3 ng/mL (ref <0.30)

## 2013-02-22 MED ORDER — LORAZEPAM 1 MG PO TABS: 1.0000 mg | ORAL_TABLET | Freq: Three times a day (TID) | ORAL | Status: DC | PRN

## 2013-02-22 MED ORDER — POTASSIUM CHLORIDE CRYS ER 20 MEQ PO TBCR
40.0000 meq | EXTENDED_RELEASE_TABLET | Freq: Once | ORAL | Status: AC
  Administered 2013-02-22: 40 meq via ORAL
  Filled 2013-02-22: qty 2

## 2013-02-22 MED ORDER — ONDANSETRON HCL 4 MG/2ML IJ SOLN
4.0000 mg | Freq: Once | INTRAMUSCULAR | Status: AC
  Administered 2013-02-22: 4 mg via INTRAVENOUS
  Filled 2013-02-22: qty 2

## 2013-02-22 MED ORDER — SODIUM CHLORIDE 0.9 % IV BOLUS (SEPSIS)
1000.0000 mL | Freq: Once | INTRAVENOUS | Status: AC
  Administered 2013-02-22: 1000 mL via INTRAVENOUS

## 2013-02-22 MED ORDER — LORAZEPAM 1 MG PO TABS
1.0000 mg | ORAL_TABLET | Freq: Once | ORAL | Status: AC
  Administered 2013-02-22: 1 mg via ORAL
  Filled 2013-02-22: qty 1

## 2013-02-22 MED ORDER — POTASSIUM CHLORIDE CRYS ER 20 MEQ PO TBCR: 20.0000 meq | EXTENDED_RELEASE_TABLET | Freq: Every day | ORAL | Status: DC

## 2013-02-22 NOTE — ED Provider Notes (Signed)
History     CSN: [id number]  Arrival date & time 02/22/13  1209   First MD Initiated Contact with Patient 02/22/13 1306      Chief Complaint  Patient presents with  . Diarrhea  . Emesis  . Dizziness    (Consider location/radiation/quality/duration/timing/severity/associated sxs/prior treatment) Patient is a 57 y.o. female presenting with diarrhea, vomiting, and weakness. The history is provided by the patient.  Diarrhea Severity:  Moderate Onset quality:  Sudden Timing:  Constant Progression:  Worsening Worsened by:  Nothing tried Ineffective treatments:  None tried Associated symptoms: vomiting   Associated symptoms: no abdominal pain   Emesis Associated symptoms: diarrhea   Associated symptoms: no abdominal pain   Weakness This is a new problem. The current episode started yesterday. The problem occurs constantly. Associated symptoms include vomiting and weakness. Pertinent negatives include no abdominal pain. Nothing aggravates the symptoms. She has tried nothing for the symptoms.  Pt complains of feeling anxious.   Pt was seen here last night for feeling like her heart was racing.    Past Medical History  Diagnosis Date  . Diabetes mellitus   . Hypertension   . Chest pressure, resolved 01/22/2012  . DM (diabetes mellitus) 01/22/2012  . Hypokalemia 01/22/2012  . Dysrhythmia   . Pacemaker     Past Surgical History  Procedure Laterality Date  . Pacemaker insertion    . Abdominal hysterectomy    . Hernia repair    . Insert / replace / remove pacemaker      Family History  Problem Relation Age of Onset  . Coronary artery disease Father     History  Substance Use Topics  . Smoking status: Never Smoker   . Smokeless tobacco: Never Used  . Alcohol Use: No    OB History   Grav Para Term Preterm Abortions TAB SAB Ect Mult Living                  Review of Systems  Gastrointestinal: Positive for vomiting and diarrhea. Negative for abdominal pain.   Neurological: Positive for weakness.  All other systems reviewed and are negative.    Allergies  Review of patient's allergies indicates no known allergies.  Home Medications   Current Outpatient Rx  Name  Route  Sig  Dispense  Refill  . amlodipine-atorvastatin (CADUET) 10-20 MG per tablet   Oral   Take 1 tablet by mouth at bedtime.          . nebivolol (BYSTOLIC) 10 MG tablet   Oral   Take 10 mg by mouth at bedtime.          . ondansetron (ZOFRAN ODT) 8 MG disintegrating tablet   Oral   Take 1 tablet (8 mg total) by mouth every 8 (eight) hours as needed for nausea.   12 tablet   0   . potassium chloride SA (K-DUR,KLOR-CON) 20 MEQ tablet   Oral   Take 1 tablet (20 mEq total) by mouth daily.   7 tablet   0   . sitaGLIPtan-metformin (JANUMET) 50-1000 MG per tablet   Oral   Take 1 tablet by mouth 2 (two) times daily with a meal.         . valsartan-hydrochlorothiazide (DIOVAN-HCT) 160-25 MG per tablet   Oral   Take 1 tablet by mouth at bedtime.            BP 141/98  Pulse 83  Temp(Src) 98.2 F (36.8 C) (Oral)  Resp 12  Ht 5\' 6"  (1.676 m)  Wt 240 lb (108.863 kg)  BMI 38.76 kg/m2  SpO2 98%  Physical Exam  Nursing note and vitals reviewed. Constitutional: She is oriented to person, place, and time. She appears well-developed and well-nourished.  HENT:  Head: Normocephalic.  Right Ear: External ear normal.  Eyes: Conjunctivae are normal. Pupils are equal, round, and reactive to light.  Neck: Normal range of motion. Neck supple.  Cardiovascular: Normal rate and normal heart sounds.   Pulmonary/Chest: Effort normal and breath sounds normal.  Abdominal: Soft. Bowel sounds are normal.  Musculoskeletal: Normal range of motion.  Neurological: She is alert and oriented to person, place, and time. She has normal reflexes.  Skin: Skin is warm.  Psychiatric: She has a normal mood and affect.    ED Course  Procedures (including critical care time)  Labs  Reviewed  COMPREHENSIVE METABOLIC PANEL - Abnormal; Notable for the following:    Potassium 3.4 (*)    Glucose, Bld 147 (*)    Calcium 10.6 (*)    GFR calc non Af Amer 63 (*)    GFR calc Af Amer 73 (*)    All other components within normal limits  URINALYSIS, ROUTINE W REFLEX MICROSCOPIC  CBC WITH DIFFERENTIAL  D-DIMER, QUANTITATIVE   No results found.   No diagnosis found.    MDM   Results for orders placed during the hospital encounter of 02/22/13  URINALYSIS, ROUTINE W REFLEX MICROSCOPIC      Result Value Range   Color, Urine YELLOW  YELLOW   APPearance CLEAR  CLEAR   Specific Gravity, Urine 1.023  1.005 - 1.030   pH 5.0  5.0 - 8.0   Glucose, UA NEGATIVE  NEGATIVE mg/dL   Hgb urine dipstick NEGATIVE  NEGATIVE   Bilirubin Urine NEGATIVE  NEGATIVE   Ketones, ur NEGATIVE  NEGATIVE mg/dL   Protein, ur NEGATIVE  NEGATIVE mg/dL   Urobilinogen, UA 0.2  0.0 - 1.0 mg/dL   Nitrite NEGATIVE  NEGATIVE   Leukocytes, UA NEGATIVE  NEGATIVE  CBC WITH DIFFERENTIAL      Result Value Range   WBC 7.9  4.0 - 10.5 K/uL   RBC 4.87  3.87 - 5.11 MIL/uL   Hemoglobin 13.4  12.0 - 15.0 g/dL   HCT 16.1  09.6 - 04.5 %   MCV 79.5  78.0 - 100.0 fL   MCH 27.5  26.0 - 34.0 pg   MCHC 34.6  30.0 - 36.0 g/dL   RDW 40.9  81.1 - 91.4 %   Platelets 317  150 - 400 K/uL   Neutrophils Relative 62  43 - 77 %   Neutro Abs 4.9  1.7 - 7.7 K/uL   Lymphocytes Relative 31  12 - 46 %   Lymphs Abs 2.4  0.7 - 4.0 K/uL   Monocytes Relative 6  3 - 12 %   Monocytes Absolute 0.5  0.1 - 1.0 K/uL   Eosinophils Relative 1  0 - 5 %   Eosinophils Absolute 0.1  0.0 - 0.7 K/uL   Basophils Relative 0  0 - 1 %   Basophils Absolute 0.0  0.0 - 0.1 K/uL  COMPREHENSIVE METABOLIC PANEL      Result Value Range   Sodium 140  135 - 145 mEq/L   Potassium 3.4 (*) 3.5 - 5.1 mEq/L   Chloride 104  96 - 112 mEq/L   CO2 24  19 - 32 mEq/L   Glucose, Bld 147 (*) 70 -  99 mg/dL   BUN 12  6 - 23 mg/dL   Creatinine, Ser 1.61  0.50 -  1.10 mg/dL   Calcium 09.6 (*) 8.4 - 10.5 mg/dL   Total Protein 7.8  6.0 - 8.3 g/dL   Albumin 3.8  3.5 - 5.2 g/dL   AST 22  0 - 37 U/L   ALT 21  0 - 35 U/L   Alkaline Phosphatase 107  39 - 117 U/L   Total Bilirubin 0.3  0.3 - 1.2 mg/dL   GFR calc non Af Amer 63 (*) >90 mL/min   GFR calc Af Amer 73 (*) >90 mL/min  D-DIMER, QUANTITATIVE      Result Value Range   D-Dimer, Quant 0.38  0.00 - 0.48 ug/mL-FEU   No results found.   I will try ativan to help with symptoms.   I advised pt to make appointment to see Dr. Loleta Chance for recheck.    Rx for ativan 1mg .        Elson Areas, PA-C 02/22/13 1607

## 2013-02-22 NOTE — ED Provider Notes (Signed)
1:22 PM  Date: 02/22/2013  Rate: 82  Rhythm: sinus tachycardia  QRS Axis: left  Intervals: normal QRS:  Left ventricular hypertrophy  ST/T Wave abnormalities: normal  Conduction Disutrbances:none  Narrative Interpretation: Borderline EKG  Old EKG Reviewed: unchanged    Carleene Cooper III, MD 02/22/13 1324

## 2013-02-22 NOTE — ED Provider Notes (Signed)
Medical screening examination/treatment/procedure(s) were performed by non-physician practitioner and as supervising physician I was immediately available for consultation/collaboration.   Gwyneth Sprout, MD 02/22/13 [phone]

## 2013-02-22 NOTE — ED Notes (Signed)
Vomiting, diarrhea, dizziness, feels weak since last night.  Seen here last for same.  Rx. ror K+ given and instructed to f/u with PCP.  States sx are much worse today.

## 2013-02-22 NOTE — ED Notes (Signed)
Pt sts she is feeling very anxious and denies h/o same.

## 2013-04-24 ENCOUNTER — Ambulatory Visit (INDEPENDENT_AMBULATORY_CARE_PROVIDER_SITE_OTHER): Payer: 59 | Admitting: Cardiovascular Disease

## 2013-04-24 ENCOUNTER — Encounter: Payer: Self-pay | Admitting: Cardiovascular Disease

## 2013-04-24 ENCOUNTER — Other Ambulatory Visit: Payer: Self-pay | Admitting: Cardiovascular Disease

## 2013-04-24 VITALS — BP 122/88 | HR 94 | Ht 66.0 in | Wt 235.4 lb

## 2013-04-24 DIAGNOSIS — E119 Type 2 diabetes mellitus without complications: Secondary | ICD-10-CM

## 2013-04-24 DIAGNOSIS — I455 Other specified heart block: Secondary | ICD-10-CM | POA: Insufficient documentation

## 2013-04-24 DIAGNOSIS — Z86711 Personal history of pulmonary embolism: Secondary | ICD-10-CM

## 2013-04-24 DIAGNOSIS — Z950 Presence of cardiac pacemaker: Secondary | ICD-10-CM

## 2013-04-24 DIAGNOSIS — I10 Essential (primary) hypertension: Secondary | ICD-10-CM | POA: Insufficient documentation

## 2013-04-24 DIAGNOSIS — I495 Sick sinus syndrome: Secondary | ICD-10-CM

## 2013-04-24 LAB — PACEMAKER DEVICE OBSERVATION
AL AMPLITUDE: 2.1 mv
AL IMPEDENCE PM: 432 Ohm
AL THRESHOLD: 0.75 v
ATRIAL PACING PM: 1
BAMS-0001: 180 {beats}/min
BAMS-0003: 50 {beats}/min
BATTERY VOLTAGE: 2.74 v
DEVICE MODEL PM: 1357232
RV LEAD AMPLITUDE: 9.1 mv
RV LEAD IMPEDENCE PM: 454 Ohm
RV LEAD THRESHOLD: 1.75 v
VENTRICULAR PACING PM: 1

## 2013-04-24 MED ORDER — NEBIVOLOL HCL 10 MG PO TABS: 10.0000 mg | ORAL_TABLET | Freq: Every day | ORAL | Status: DC

## 2013-04-24 NOTE — Assessment & Plan Note (Signed)
Occurred in 2005. She has been off anticoagulants for years without recurrence. No clear cause has ever been identified. She was not taking oral contraceptives and was not immobilized for ill at the time.

## 2013-04-24 NOTE — Progress Notes (Signed)
In clinic pacemaker interrogation. Normal device function. No changes made this session. Will follow up in 6 months.

## 2013-04-24 NOTE — Assessment & Plan Note (Addendum)
Recent elevation in glucose appears to be related to her urinary tract infection, but her hemoglobin A1c is also high suggesting long-term medications also need to be adjusted. Her metformin was recently "upgraded" to Janumet. Her most recent lipid profile is not available for review. She has normal renal function with a creatinine of 0.9 last December.

## 2013-04-24 NOTE — Assessment & Plan Note (Signed)
Control appears good.

## 2013-04-24 NOTE — Assessment & Plan Note (Signed)
[employer] Verity [phone] implanted October 2005. Virtually no atrial pacing or ventricular pacing occurs. Battery voltage suggests at least another 5 years of service. Has had no episodes of mode switch. Atrial lead parameters are excellent, but ventricular pacing thresholds are slightly high. Ventricular capture is currently recorded at 1.75 mV to 0.6 ms. Please refer to the separate device noted.

## 2013-04-24 NOTE — Patient Instructions (Addendum)
Normal device interrogation. Your physician recommends that you schedule a follow-up appointment in: December 2014 for a device interrogation, and then with the physician in 12 months.

## 2013-04-24 NOTE — Progress Notes (Signed)
Patient ID: Lauren Wilkins, female   DOB: 01/19/1956, 57 y.o.   MRN: [id number]  Reason for office visit Followup for hypertension and pacemaker check  Lauren Wilkins has done quite well from a cardiorespiratory standpoint in the year that has passed since her last encounter. Chest had no cardiac gastric complaints whatsoever. Device function is normal and she almost never requires pacing. She has had no recurrence of venous thromboembolic events.  She is under psychological stress since her brother appears to be having serious mental health problems and she is trying to help him receive appropriate care  No Known Allergies  Current Outpatient Prescriptions  Medication Sig Dispense Refill  . amlodipine-atorvastatin (CADUET) 10-20 MG per tablet Take 1 tablet by mouth at bedtime.       . nebivolol (BYSTOLIC) 10 MG tablet Take 1 tablet (10 mg total) by mouth at bedtime.  28 tablet  0  . sitaGLIPtan-metformin (JANUMET) 50-1000 MG per tablet Take 1 tablet by mouth 2 (two) times daily with a meal.      . LORazepam (ATIVAN) 1 MG tablet Take 1 tablet (1 mg total) by mouth 3 (three) times daily as needed for anxiety.  15 tablet  0  . ondansetron (ZOFRAN ODT) 8 MG disintegrating tablet Take 1 tablet (8 mg total) by mouth every 8 (eight) hours as needed for nausea.  12 tablet  0  . potassium chloride SA (K-DUR,KLOR-CON) 20 MEQ tablet Take 1 tablet (20 mEq total) by mouth daily.  7 tablet  0  . valsartan-hydrochlorothiazide (DIOVAN-HCT) 160-25 MG per tablet Take 1 tablet by mouth at bedtime.        No current facility-administered medications for this visit.    Past Medical History  Diagnosis Date  . Diabetes mellitus   . Hypertension   . Chest pressure, resolved 01/22/2012  . DM (diabetes mellitus) 01/22/2012  . Hypokalemia 01/22/2012  . Dysrhythmia   . Pacemaker     Past Surgical History  Procedure Laterality Date  . Pacemaker insertion    . Abdominal hysterectomy    . Hernia repair    . Insert /  replace / remove pacemaker      Family History  Problem Relation Age of Onset  . Coronary artery disease Father     History   Social History  . Marital Status: Single    Spouse Name: N/A    Number of Children: N/A  . Years of Education: N/A   Occupational History  . Not on file.   Social History Main Topics  . Smoking status: Never Smoker   . Smokeless tobacco: Never Used  . Alcohol Use: No  . Drug Use: No  . Sexually Active: Not Currently   Other Topics Concern  . Not on file   Social History Narrative  . No narrative on file    Review of systems: The patient specifically denies any chest pain at rest exertion, dyspnea at rest or with exertion, orthopnea, paroxysmal nocturnal dyspnea, syncope, palpitations, focal neurological deficits, intermittent claudication, lower extremity edema, unexplained weight gain, cough, hemoptysis or wheezing.   PHYSICAL EXAM BP 122/88  Pulse 94  Ht 5\' 6"  (1.676 m)  Wt 235 lb 6.4 oz (106.777 kg)  BMI 38.01 kg/m2  General: Alert, oriented x3, no distress, severely obese Head: no evidence of trauma, PERRL, EOMI, no exophtalmos or lid lag, no myxedema, no xanthelasma; normal ears, nose and oropharynx Neck: normal jugular venous pulsations and no hepatojugular reflux; brisk carotid pulses without delay and  no carotid bruits Chest: clear to auscultation, no signs of consolidation by percussion or palpation, normal fremitus, symmetrical and full respiratory excursions; healthy subclavian pacemaker site Cardiovascular: normal position and quality of the apical impulse, regular rhythm, normal first and second heart sounds, no murmurs, rubs or gallops Abdomen: no tenderness or distention, no masses by palpation, no abnormal pulsatility or arterial bruits, normal bowel sounds, no hepatosplenomegaly Extremities: no clubbing, cyanosis or edema; 2+ radial, ulnar and brachial pulses bilaterally; 2+ right femoral, posterior tibial and dorsalis pedis  pulses; 2+ left femoral, posterior tibial and dorsalis pedis pulses; no subclavian or femoral bruits Neurological: grossly nonfocal   EKG: Normal sinus rhythm, possible left atrial enlargement, questionable left ventricular hypertrophy    Lipid Panel     Component Value Date/Time   CHOL  Value: 183        ATP III CLASSIFICATION:  <200     mg/dL   Desirable  147-829  mg/dL   Borderline High  >=562    mg/dL   High        12/22/8655 1600   TRIG 71 08/18/2009 1600   HDL 52 08/18/2009 1600   CHOLHDL 3.5 08/18/2009 1600   VLDL 14 08/18/2009 1600   LDLCALC  Value: 117        Total Cholesterol/HDL:CHD Risk Coronary Heart Disease Risk Table                     Men   Women  1/2 Average Risk   3.4   3.3  Average Risk       5.0   4.4  2 X Average Risk   9.6   7.1  3 X Average Risk  23.4   11.0        Use the calculated Patient Ratio above and the CHD Risk Table to determine the patient's CHD Risk.        ATP III CLASSIFICATION (LDL):  <100     mg/dL   Optimal  846-962  mg/dL   Near or Above                    Optimal  130-159  mg/dL   Borderline  952-841  mg/dL   High  >324     mg/dL   Very High* 02/21/271 1600    BMET    Component Value Date/Time   NA 140 02/22/2013 1400   K 3.4* 02/22/2013 1400   CL 104 02/22/2013 1400   CO2 24 02/22/2013 1400   GLUCOSE 147* 02/22/2013 1400   BUN 12 02/22/2013 1400   CREATININE 1.00 02/22/2013 1400   CALCIUM 10.6* 02/22/2013 1400   GFRNONAA 63* 02/22/2013 1400   GFRAA 73* 02/22/2013 1400     ASSESSMENT AND PLAN  Pacemaker [employer] Verity [id number] implanted October 2005. Virtually no atrial pacing or ventricular pacing occurs. Battery voltage suggests at least another 5 years of service. Has had no episodes of mode switch. Atrial lead parameters are excellent, but ventricular pacing thresholds are slightly high. Ventricular capture is currently recorded at 1.75 mV to 0.6 ms. Please refer to the separate device noted.  History of pulmonary embolism Occurred in 2005. She has been off  anticoagulants for years without recurrence. No clear cause has ever been identified. She was not taking oral contraceptives and was not immobilized for ill at the time.  HTN (hypertension) Control appears good.  DM (diabetes mellitus) Recent elevation in glucose appears to be related to  her urinary tract infection, but her hemoglobin A1c is also high suggesting long-term medications also need to be adjusted. Her metformin was recently "upgraded" to Janumet. Her most recent lipid profile is not available for review. She has normal renal function with a creatinine of 0.9 last December.   She will return for pacemaker device check in the office in 6 months and for clinical appointment in one year  Liz Pinho  Thurmon Fair, MD, [facility] [phone] office [phone] pager

## 2013-05-30 ENCOUNTER — Encounter: Payer: Self-pay | Admitting: Cardiovascular Disease

## 2013-07-19 ENCOUNTER — Ambulatory Visit: Payer: 59 | Admitting: Family Medicine

## 2013-07-24 ENCOUNTER — Encounter (HOSPITAL_BASED_OUTPATIENT_CLINIC_OR_DEPARTMENT_OTHER): Payer: Self-pay | Admitting: Emergency Medicine

## 2013-07-24 ENCOUNTER — Emergency Department (HOSPITAL_BASED_OUTPATIENT_CLINIC_OR_DEPARTMENT_OTHER)
Admission: EM | Admit: 2013-07-24 | Discharge: 2013-07-24 | Disposition: A | Discharge status: Home / Self Care | Payer: 59 | Attending: Emergency Medicine | Admitting: Emergency Medicine

## 2013-07-24 DIAGNOSIS — R7309 Other abnormal glucose: Secondary | ICD-10-CM | POA: Insufficient documentation

## 2013-07-24 DIAGNOSIS — Z8639 Personal history of other endocrine, nutritional and metabolic disease: Secondary | ICD-10-CM | POA: Insufficient documentation

## 2013-07-24 DIAGNOSIS — Z950 Presence of cardiac pacemaker: Secondary | ICD-10-CM | POA: Insufficient documentation

## 2013-07-24 DIAGNOSIS — Z862 Personal history of diseases of the blood and blood-forming organs and certain disorders involving the immune mechanism: Secondary | ICD-10-CM | POA: Insufficient documentation

## 2013-07-24 DIAGNOSIS — Z79899 Other long term (current) drug therapy: Secondary | ICD-10-CM | POA: Insufficient documentation

## 2013-07-24 DIAGNOSIS — E119 Type 2 diabetes mellitus without complications: Secondary | ICD-10-CM | POA: Insufficient documentation

## 2013-07-24 DIAGNOSIS — Z8679 Personal history of other diseases of the circulatory system: Secondary | ICD-10-CM | POA: Insufficient documentation

## 2013-07-24 DIAGNOSIS — R110 Nausea: Secondary | ICD-10-CM | POA: Insufficient documentation

## 2013-07-24 DIAGNOSIS — R739 Hyperglycemia, unspecified: Secondary | ICD-10-CM

## 2013-07-24 DIAGNOSIS — I10 Essential (primary) hypertension: Secondary | ICD-10-CM | POA: Insufficient documentation

## 2013-07-24 LAB — CBC WITH DIFFERENTIAL/PLATELET
Basophils Relative: 0 % (ref 0–1)
Eosinophils Relative: 1 % (ref 0–5)
HCT: 42.5 % (ref 36.0–46.0)
Hemoglobin: 14.7 g/dL (ref 12.0–15.0)
MCHC: 34.6 g/dL (ref 30.0–36.0)
MCV: 79.4 fL (ref 78.0–100.0)
Monocytes Absolute: 0.5 10*3/uL (ref 0.1–1.0)
Monocytes Relative: 7 % (ref 3–12)
Neutro Abs: 4.8 10*3/uL (ref 1.7–7.7)

## 2013-07-24 LAB — COMPREHENSIVE METABOLIC PANEL
Albumin: 4 g/dL (ref 3.5–5.2)
BUN: 18 mg/dL (ref 6–23)
CO2: 25 mEq/L (ref 19–32)
Chloride: 99 mEq/L (ref 96–112)
Creatinine, Ser: 1 mg/dL (ref 0.50–1.10)
GFR calc non Af Amer: 62 mL/min — ABNORMAL LOW (ref >90)
Total Bilirubin: 0.4 mg/dL (ref 0.3–1.2)

## 2013-07-24 LAB — GLUCOSE, CAPILLARY: Glucose-Capillary: 218 mg/dL — ABNORMAL HIGH (ref 70–99)

## 2013-07-24 LAB — LIPASE, BLOOD: Lipase: 48 U/L (ref 11–59)

## 2013-07-24 MED ORDER — ONDANSETRON HCL 4 MG/2ML IJ SOLN
4.0000 mg | Freq: Once | INTRAMUSCULAR | Status: AC
  Administered 2013-07-24: 4 mg via INTRAVENOUS
  Filled 2013-07-24: qty 2

## 2013-07-24 MED ORDER — SODIUM CHLORIDE 0.9 % IV BOLUS (SEPSIS)
1000.0000 mL | Freq: Once | INTRAVENOUS | Status: AC
  Administered 2013-07-24: 1000 mL via INTRAVENOUS

## 2013-07-24 MED ORDER — FAMOTIDINE IN NACL 20-0.9 MG/50ML-% IV SOLN
20.0000 mg | Freq: Once | INTRAVENOUS | Status: AC
  Administered 2013-07-24: 20 mg via INTRAVENOUS
  Filled 2013-07-24: qty 50

## 2013-07-24 NOTE — ED Notes (Signed)
Pt refuses EKG at this time, MD made aware.

## 2013-07-24 NOTE — ED Notes (Signed)
Pt reports that md is adjusting her medication and blood glucose has been elevated, this evening however she developed nausea and metallic taste in her mouth

## 2013-07-24 NOTE — ED Provider Notes (Addendum)
CSN: [id number]     Arrival date & time 07/24/13  2019 History   First MD Initiated Contact with Patient 07/24/13 2028     Chief Complaint  Patient presents with  . Hyperglycemia   (Consider location/radiation/quality/duration/timing/severity/associated sxs/prior Treatment) The history is provided by the patient.  Cosima Prentiss is a 57 y.o. female history hypertension, diabetes presenting with telemetry taste in her mouth and nausea. This has been off the last 2-3 days. He's been checking her blood sugar recently and been running upper 200s to lower 300s. Felt nauseous but didn't vomit. Denies abdominal pain or chest pain. Denies recent changes of her medicines. Denies fevers or chills.    Past Medical History  Diagnosis Date  . Diabetes mellitus   . Hypertension   . Chest pressure, resolved 01/22/2012  . DM (diabetes mellitus) 01/22/2012  . Hypokalemia 01/22/2012  . Dysrhythmia   . Pacemaker    Past Surgical History  Procedure Laterality Date  . Pacemaker insertion    . Abdominal hysterectomy    . Hernia repair    . Insert / replace / remove pacemaker     Family History  Problem Relation Age of Onset  . Coronary artery disease Father    History  Substance Use Topics  . Smoking status: Never Smoker   . Smokeless tobacco: Never Used  . Alcohol Use: No   OB History   Grav Para Term Preterm Abortions TAB SAB Ect Mult Living                 Review of Systems  Gastrointestinal: Positive for nausea.  All other systems reviewed and are negative.    Allergies  Review of patient's allergies indicates no known allergies.  Home Medications   Current Outpatient Rx  Name  Route  Sig  Dispense  Refill  . amlodipine-atorvastatin (CADUET) 10-20 MG per tablet   Oral   Take 1 tablet by mouth at bedtime.          . nebivolol (BYSTOLIC) 10 MG tablet   Oral   Take 1 tablet (10 mg total) by mouth at bedtime.   28 tablet   0     [id number] EXP: 08/16   . sitaGLIPtan-metformin  (JANUMET) 50-1000 MG per tablet   Oral   Take 1 tablet by mouth 2 (two) times daily with a meal.         . valsartan-hydrochlorothiazide (DIOVAN-HCT) 160-25 MG per tablet   Oral   Take 1 tablet by mouth at bedtime.          Marland Kitchen LORazepam (ATIVAN) 1 MG tablet   Oral   Take 1 tablet (1 mg total) by mouth 3 (three) times daily as needed for anxiety.   15 tablet   0   . ondansetron (ZOFRAN ODT) 8 MG disintegrating tablet   Oral   Take 1 tablet (8 mg total) by mouth every 8 (eight) hours as needed for nausea.   12 tablet   0   . potassium chloride SA (K-DUR,KLOR-CON) 20 MEQ tablet   Oral   Take 1 tablet (20 mEq total) by mouth daily.   7 tablet   0    BP 150/102  Pulse 84  Temp(Src) 98.6 F (37 C) (Oral)  Resp 18  SpO2 98% Physical Exam  Nursing note and vitals reviewed. Constitutional: She is oriented to person, place, and time. She appears well-developed and well-nourished.  Comfortable   HENT:  Head: Normocephalic.  Mouth/Throat: Oropharynx is clear  and moist.  Eyes: Conjunctivae are normal. Pupils are equal, round, and reactive to light.  Neck: Normal range of motion. Neck supple.  Cardiovascular: Normal rate, regular rhythm and normal heart sounds.   Pulmonary/Chest: Effort normal and breath sounds normal. No respiratory distress. She has no wheezes. She has no rales.  Abdominal: Soft. Bowel sounds are normal. She exhibits no distension. There is no tenderness. There is no rebound and no guarding.  Musculoskeletal: Normal range of motion.  Neurological: She is alert and oriented to person, place, and time.  Skin: Skin is warm and dry.  Psychiatric: She has a normal mood and affect. Her behavior is normal. Judgment and thought content normal.    ED Course  Procedures (including critical care time) Labs Review Labs Reviewed  GLUCOSE, CAPILLARY - Abnormal; Notable for the following:    Glucose-Capillary 218 (*)    All other components within normal limits  CBC  WITH DIFFERENTIAL - Abnormal; Notable for the following:    RBC 5.35 (*)    All other components within normal limits  COMPREHENSIVE METABOLIC PANEL - Abnormal; Notable for the following:    Potassium 3.3 (*)    Glucose, Bld 249 (*)    Calcium 10.7 (*)    Alkaline Phosphatase 143 (*)    GFR calc non Af Amer 62 (*)    GFR calc Af Amer 72 (*)    All other components within normal limits  GLUCOSE, CAPILLARY - Abnormal; Notable for the following:    Glucose-Capillary 195 (*)    All other components within normal limits  LIPASE, BLOOD  TROPONIN I   Imaging Review No results found.  MDM  No diagnosis found. Rianne Degraaf is a 57 y.o. female here with nausea. Likely medication side effect of januvia vs reflux. Atypical for ACS and symptoms for several days so will get EKG and trop x 1. Will check cbc, cmp and give IVF and pepcid and zofran and reassess.   10:21 PM CBG dec from 218 to 195 with IVF alone. Labs other than hyperglycemia, is unremarkable. Patient refused EKG but trop x 1 neg (symptoms for several days) Likely reflux. Will d/c home on pepcid prn.    Richardean Canal, MD 07/24/13 2222  Richardean Canal, MD 07/24/13 2231

## 2013-07-30 LAB — BASIC METABOLIC PANEL
BUN: 19 mg/dL (ref 4–21)
CREATININE: 1 mg/dL (ref <=1.1)
Glucose: 224 mg/dL
SODIUM: 138 mmol/L (ref 137–147)

## 2013-07-30 LAB — HEPATIC FUNCTION PANEL: BILIRUBIN, TOTAL: 0.6 mg/dL

## 2013-09-10 ENCOUNTER — Other Ambulatory Visit: Payer: Self-pay

## 2013-09-10 DIAGNOSIS — Z1231 Encounter for screening mammogram for malignant neoplasm of breast: Secondary | ICD-10-CM

## 2013-09-11 ENCOUNTER — Ambulatory Visit: Payer: 59 | Admitting: Family Medicine

## 2013-10-01 ENCOUNTER — Other Ambulatory Visit: Payer: Self-pay | Admitting: *Deleted

## 2013-10-01 DIAGNOSIS — I495 Sick sinus syndrome: Secondary | ICD-10-CM

## 2013-10-01 MED ORDER — NEBIVOLOL HCL 10 MG PO TABS: 10.0000 mg | ORAL_TABLET | Freq: Every day | ORAL | Status: DC

## 2013-10-01 NOTE — Telephone Encounter (Signed)
Pt called back and stated insurance will only pay for 30-day.  Informed Rx will be sent.  Pt verbalized understanding and agreed w/ plan.  Refill(s) sent to pharmacy.

## 2013-10-01 NOTE — Telephone Encounter (Signed)
Returned call and pt verified x 2.  Pt informed message received.  Stated she usually uses [facility], but they don't have any in stock and said she can get it from the [facility]).  RN asked pt if she wants 30-day or 90-day supply and pt unsure.  Will call to get clarity on which is covered and call back w/ info so Rx can be sent.

## 2013-10-01 NOTE — Telephone Encounter (Signed)
Pt is calling in regards to a Rx refill on Bystolic. She stated that she called her pharmacy and they said that they did not have it on file.

## 2013-10-22 ENCOUNTER — Ambulatory Visit (INDEPENDENT_AMBULATORY_CARE_PROVIDER_SITE_OTHER): Payer: 59 | Admitting: Family Medicine

## 2013-10-22 ENCOUNTER — Encounter: Payer: Self-pay | Admitting: Family Medicine

## 2013-10-22 VITALS — BP 128/92 | HR 82 | Temp 98.2°F | Resp 16 | Ht 66.5 in | Wt 249.2 lb

## 2013-10-22 DIAGNOSIS — E119 Type 2 diabetes mellitus without complications: Secondary | ICD-10-CM

## 2013-10-22 DIAGNOSIS — E785 Hyperlipidemia, unspecified: Secondary | ICD-10-CM

## 2013-10-22 DIAGNOSIS — I10 Essential (primary) hypertension: Secondary | ICD-10-CM

## 2013-10-22 DIAGNOSIS — Z1211 Encounter for screening for malignant neoplasm of colon: Secondary | ICD-10-CM

## 2013-10-22 LAB — HEPATIC FUNCTION PANEL
ALT: 25 U/L (ref 0–35)
Albumin: 3.8 g/dL (ref 3.5–5.2)
Alkaline Phosphatase: 96 U/L (ref 39–117)
Bilirubin, Direct: 0 mg/dL (ref 0.0–0.3)
Total Protein: 7.2 g/dL (ref 6.0–8.3)

## 2013-10-22 LAB — BASIC METABOLIC PANEL
CO2: 25 mEq/L (ref 19–32)
Calcium: 9.7 mg/dL (ref 8.4–10.5)
Chloride: 105 mEq/L (ref 96–112)
Glucose, Bld: 134 mg/dL — ABNORMAL HIGH (ref 70–99)
Sodium: 139 mEq/L (ref 135–145)

## 2013-10-22 LAB — CBC WITH DIFFERENTIAL/PLATELET
Basophils Absolute: 0 10*3/uL (ref 0.0–0.1)
Eosinophils Relative: 0.8 % (ref 0.0–5.0)
HCT: 42.8 % (ref 36.0–46.0)
Hemoglobin: 13.9 g/dL (ref 12.0–15.0)
Lymphocytes Relative: 38.2 % (ref 12.0–46.0)
Lymphs Abs: 2 10*3/uL (ref 0.7–4.0)
Monocytes Relative: 5.8 % (ref 3.0–12.0)
Neutro Abs: 2.9 10*3/uL (ref 1.4–7.7)
RDW: 15.1 % — ABNORMAL HIGH (ref 11.5–14.6)
WBC: 5.3 10*3/uL (ref 4.5–10.5)

## 2013-10-22 LAB — LIPID PANEL
LDL Cholesterol: 93 mg/dL (ref 0–99)
Total CHOL/HDL Ratio: 4

## 2013-10-22 NOTE — Assessment & Plan Note (Signed)
New to provider, chronic for pt.  Tolerating statin.  Check labs.  Adjust meds prn  

## 2013-10-22 NOTE — Progress Notes (Signed)
   Subjective:    Patient ID: Lauren Wilkins, female    DOB: [birth year], 57 y.o.   MRN: [id number]  HPI Pre visit review using our clinic review tool, if applicable. No additional management support is needed unless otherwise documented below in the visit note.  New to establish.  Previous MD- Dr Loleta Chance [facility])  GYN- following Q2 yrs  DM- dx'd 2013, currently on Metformin and Lantus.  Due for eye exam January.  On Valsartan for renal protection and BP control.  Checks CBGs at home, 'it all depends on what I eat'.  Following w/ Dr Sharl Ma (endocrinology)- has appt this month.  HTN- chronic problem, on Bystolic, Amlodipine, Diovan HCTZ.  No CP, SOB, HAs, visual changes, edema.  Hyperlipidemia- on Atorvastatin for cholesterol.  No abd pain, N/V, myalgias.  Pace maker- following w/ Dr C yearly and having pacer interrogated q6 months.  Health maintenance- willing to have procedure but lives alone and has no one to drive her after scope.   Review of Systems For ROS see HPI     Objective:   Physical Exam  Vitals reviewed. Constitutional: She is oriented to person, place, and time. She appears well-developed and well-nourished. No distress.  HENT:  Head: Normocephalic and atraumatic.  Eyes: Conjunctivae and EOM are normal. Pupils are equal, round, and reactive to light.  Neck: Normal range of motion. Neck supple. No thyromegaly present.  Cardiovascular: Normal rate, regular rhythm, normal heart sounds and intact distal pulses.   No murmur heard. Pulmonary/Chest: Effort normal and breath sounds normal. No respiratory distress.  Abdominal: Soft. She exhibits no distension. There is no tenderness.  Musculoskeletal: She exhibits no edema.  Lymphadenopathy:    She has no cervical adenopathy.  Neurological: She is alert and oriented to person, place, and time.  Skin: Skin is warm and dry.  Psychiatric: She has a normal mood and affect. Her behavior is normal.          Assessment &  Plan:

## 2013-10-22 NOTE — Patient Instructions (Signed)
Schedule your complete physical in 6 months We'll notify you of your lab results and make any changes if needed (we'll also fax them to Dr Sharl Ma) Keep up the good work on healthy diet and regular exercise We'll call you with your GI appt to discuss colonoscopy Call with any questions or concerns Welcome!  We're glad to have you! Happy Holidays!

## 2013-10-22 NOTE — Assessment & Plan Note (Signed)
New to provider, chronic for pt.  BP mildly elevated.  Check labs.  No anticipated med changes.  Will follow.

## 2013-10-22 NOTE — Assessment & Plan Note (Signed)
New to provider, ongoing for pt.  Following w/ Endo.  On ARB.  UTD on eye exam.  Will follow along.

## 2013-10-23 ENCOUNTER — Encounter: Payer: Self-pay | Admitting: General Practice

## 2013-11-19 ENCOUNTER — Ambulatory Visit: Payer: 59

## 2013-12-04 ENCOUNTER — Encounter: Payer: 59 | Admitting: Cardiovascular Disease

## 2013-12-13 ENCOUNTER — Ambulatory Visit
Admission: RE | Admit: 2013-12-13 | Discharge: 2013-12-13 | Disposition: A | Discharge status: Home / Self Care | Payer: Private Health Insurance - Indemnity | Source: Ambulatory Visit

## 2013-12-13 DIAGNOSIS — Z1231 Encounter for screening mammogram for malignant neoplasm of breast: Secondary | ICD-10-CM

## 2014-04-19 ENCOUNTER — Telehealth: Payer: Self-pay

## 2014-04-19 NOTE — Telephone Encounter (Signed)
Patient called to return your phone call. Please advise.  °

## 2014-04-19 NOTE — Telephone Encounter (Signed)
Left message for call back Non-identifiable   Pap- 10/23/11- normal; hx. hysterectomy [facility] MMG- 12/13/13-negative Td

## 2014-04-19 NOTE — Telephone Encounter (Signed)
Medication and allergies:  Reviewed and updated  90 day supply/mail order: n/a Local pharmacy:  [id number] [place], [place] - [address]   Immunizations due:  Td/tdap   A/P: Personal, family history and past surgical hx:  Reviewed and updated PAP- 10/23/11-normal; hx. hysterectomy [facility]- never received one MMG- 12/13/13- negative Tdap- least vaccine greater than 10 years ago  To Discuss with Provider: Pt does not want her labs sent to [facility] because she works there.

## 2014-04-22 ENCOUNTER — Encounter: Payer: Self-pay | Admitting: Family Medicine

## 2014-04-22 ENCOUNTER — Ambulatory Visit (INDEPENDENT_AMBULATORY_CARE_PROVIDER_SITE_OTHER): Payer: Private Health Insurance - Indemnity | Admitting: Family Medicine

## 2014-04-22 ENCOUNTER — Encounter: Payer: Self-pay | Admitting: General Practice

## 2014-04-22 VITALS — BP 126/84 | HR 99 | Temp 98.2°F | Resp 16 | Ht 66.5 in | Wt 255.1 lb

## 2014-04-22 DIAGNOSIS — Z0000 Encounter for general adult medical examination without abnormal findings: Secondary | ICD-10-CM

## 2014-04-22 DIAGNOSIS — Z1211 Encounter for screening for malignant neoplasm of colon: Secondary | ICD-10-CM

## 2014-04-22 LAB — CBC WITH DIFFERENTIAL/PLATELET
BASOS ABS: 0.1 10*3/uL (ref 0.0–0.1)
Basophils Relative: 1 % (ref 0.0–3.0)
EOS PCT: 0.7 % (ref 0.0–5.0)
Eosinophils Absolute: 0 10*3/uL (ref 0.0–0.7)
HEMATOCRIT: 43.6 % (ref 36.0–46.0)
HEMOGLOBIN: 14.2 g/dL (ref 12.0–15.0)
LYMPHS ABS: 2.5 10*3/uL (ref 0.7–4.0)
Lymphocytes Relative: 38.4 % (ref 12.0–46.0)
MCHC: 32.5 g/dL (ref 30.0–36.0)
MCV: 83.6 fl (ref 78.0–100.0)
MONO ABS: 0.3 10*3/uL (ref 0.1–1.0)
Monocytes Relative: 4.4 % (ref 3.0–12.0)
NEUTROS ABS: 3.7 10*3/uL (ref 1.4–7.7)
Neutrophils Relative %: 55.5 % (ref 43.0–77.0)
PLATELETS: 301 10*3/uL (ref 150.0–400.0)
RBC: 5.22 Mil/uL — ABNORMAL HIGH (ref 3.87–5.11)
RDW: 15.7 % — AB (ref 11.5–15.5)
WBC: 6.6 10*3/uL (ref 4.0–10.5)

## 2014-04-22 LAB — BASIC METABOLIC PANEL
BUN: 19 mg/dL (ref 6–23)
CHLORIDE: 107 meq/L (ref 96–112)
CO2: 23 mEq/L (ref 19–32)
Calcium: 9.6 mg/dL (ref 8.4–10.5)
Creatinine, Ser: 0.9 mg/dL (ref 0.4–1.2)
GFR: 80.17 mL/min (ref >60.00)
Glucose, Bld: 166 mg/dL — ABNORMAL HIGH (ref 70–99)
POTASSIUM: 3.6 meq/L (ref 3.5–5.1)
SODIUM: 138 meq/L (ref 135–145)

## 2014-04-22 LAB — LIPID PANEL
CHOL/HDL RATIO: 3
Cholesterol: 191 mg/dL (ref 0–200)
HDL: 54.6 mg/dL (ref >39.00)
LDL CALC: 100 mg/dL — AB (ref 0–99)
Triglycerides: 180 mg/dL — ABNORMAL HIGH (ref 0.0–149.0)
VLDL: 36 mg/dL (ref 0.0–40.0)

## 2014-04-22 LAB — HEPATIC FUNCTION PANEL
ALK PHOS: 118 U/L — AB (ref 39–117)
ALT: 36 U/L — ABNORMAL HIGH (ref 0–35)
AST: 27 U/L (ref 0–37)
Albumin: 3.9 g/dL (ref 3.5–5.2)
BILIRUBIN TOTAL: 0.5 mg/dL (ref 0.2–1.2)
Bilirubin, Direct: 0 mg/dL (ref 0.0–0.3)
Total Protein: 7.3 g/dL (ref 6.0–8.3)

## 2014-04-22 LAB — TSH: TSH: 2.05 u[IU]/mL (ref 0.35–4.50)

## 2014-04-22 LAB — HM DIABETES EYE EXAM

## 2014-04-22 NOTE — Progress Notes (Signed)
Pre visit review using our clinic review tool, if applicable. No additional management support is needed unless otherwise documented below in the visit note. 

## 2014-04-22 NOTE — Progress Notes (Signed)
   Subjective:    Patient ID: Lauren Wilkins, female    DOB: Jul 25, 1956, 58 y.o.   MRN: [id number]  HPI CPE- UTD on mammo, pap.  Due for colonoscopy.  GYN- [facility]  DM- following w/ Dr Sharl Ma   Review of Systems Patient reports no vision/ hearing changes, adenopathy,fever, weight change,  persistant/recurrent hoarseness , swallowing issues, chest pain, palpitations, edema, persistant/recurrent cough, hemoptysis, dyspnea (rest/exertional/paroxysmal nocturnal), gastrointestinal bleeding (melena, rectal bleeding), abdominal pain, significant heartburn, bowel changes, GU symptoms (dysuria, hematuria, incontinence), Gyn symptoms (abnormal  bleeding, pain),  syncope, focal weakness, memory loss, numbness & tingling, skin/hair/nail changes, abnormal bruising or bleeding, anxiety, or depression.     Objective:   Physical Exam General Appearance:    Alert, cooperative, no distress, appears stated age, obese  Head:    Normocephalic, without obvious abnormality, atraumatic  Eyes:    PERRL, conjunctiva/corneas clear, EOM's intact, fundi    benign, both eyes  Ears:    Normal TM's and external ear canals, both ears  Nose:   Nares normal, septum midline, mucosa normal, no drainage    or sinus tenderness  Throat:   Lips, mucosa, and tongue normal; teeth and gums normal  Neck:   Supple, symmetrical, trachea midline, no adenopathy;    Thyroid: no enlargement/tenderness/nodules  Back:     Symmetric, no curvature, ROM normal, no CVA tenderness  Lungs:     Clear to auscultation bilaterally, respirations unlabored  Chest Wall:    No tenderness or deformity   Heart:    Regular rate and rhythm, S1 and S2 normal, no murmur, rub   or gallop  Breast Exam:    Deferred to GYN  Abdomen:     Soft, non-tender, bowel sounds active all four quadrants,    no masses, no organomegaly  Genitalia:    Deferred to GYN  Rectal:    Extremities:   Extremities normal, atraumatic, no cyanosis or edema  Pulses:   2+ and  symmetric all extremities  Skin:   Skin color, texture, turgor normal, no rashes or lesions  Lymph nodes:   Cervical, supraclavicular, and axillary nodes normal  Neurologic:   CNII-XII intact, normal strength, sensation and reflexes    throughout          Assessment & Plan:

## 2014-04-22 NOTE — Patient Instructions (Signed)
Follow up in 6 months to recheck BP and cholesterol We'll notify you of your lab results and make any changes if needed Try and make healthy food choices and get regular exercise We'll call you with your GI appt to discuss colonoscopy Please schedule with Dr Sharl Ma to discuss your sugars Call with any questions or concerns Hang in there!!

## 2014-04-22 NOTE — Assessment & Plan Note (Signed)
Pt's PE WNL w/ exception of obesity.  Pt has GYN and Endo.  UTD on mammo.  Has attempted to get colonoscopy but has no one to drive her home.  Will refer for consultation b/c pt thinks sister will be visiting later this summer.  Check labs.  Anticipatory guidance provided.

## 2014-04-23 ENCOUNTER — Telehealth: Payer: Self-pay | Admitting: *Deleted

## 2014-04-23 ENCOUNTER — Other Ambulatory Visit: Payer: Self-pay | Admitting: General Practice

## 2014-04-23 LAB — VITAMIN D 25 HYDROXY (VIT D DEFICIENCY, FRACTURES): VIT D 25 HYDROXY: 15 ng/mL — AB (ref 30–89)

## 2014-04-23 MED ORDER — VITAMIN D (ERGOCALCIFEROL) 1.25 MG (50000 UNIT) PO CAPS: 50000.0000 [IU] | ORAL_CAPSULE | ORAL | Status: DC

## 2014-04-23 NOTE — Telephone Encounter (Signed)
Unfortunately I did not change the resulting agency when ordering the labs (as I always order them as a panel) and pt was told by CMA at the time of her appt to make clear to the lab tech her need to have blood sent somewhere other than [facility].  All labs w/ exception of Vit D were run at [facility].  I believe that all Vit D levels (unless clearly stated to lab tech) are sent to [facility] as this is who we have our contract with.  I apologize for this mix up and I hate that she is upset.

## 2014-04-23 NOTE — Telephone Encounter (Signed)
Spoke to the patient and apologized for the miscommunication concerning her labs, patient stated that it was okay.  She was just glad that it was not anything major sent to [facility].  She was also informed about her lab results.  The following recommendations were discussed the with patient:  Per Dr. Marco Collie Vit D- based on this needs to start 50,000 units weekly x12 weeks and start daily OTC supplement 2000 units.  Pt was made aware that Vitamin D 50,000 units capsules weekly x 12 weeks [facility] [facility] [address].  She was also encouraged to pick up Vitamin D 2000 units OTC and to take it daily.    Patient stated understanding and agreed with plan.  No further questions or concerns voiced.

## 2014-04-23 NOTE — Telephone Encounter (Signed)
Left message for patient to return the call. Need to give lab results on return call

## 2014-04-23 NOTE — Telephone Encounter (Signed)
Caller name:  Lauren Wilkins Relation to pt:  self Call back number: [phone] Pharmacy:  Reason for call: Pt called this morning.  Stated she made it clear during her appointment NOT to send her labs to [facility].  She works there.  A co-worker reconized her name and brought to her attention that the labs were sent to [facility].  That is why she did not want her labs to go there.  She is upset.  Please advise.

## 2014-04-24 ENCOUNTER — Telehealth: Payer: Self-pay | Admitting: Cardiovascular Disease

## 2014-04-25 ENCOUNTER — Encounter: Payer: Private Health Insurance - Indemnity | Admitting: Cardiovascular Disease

## 2014-04-25 LAB — HM DIABETES FOOT EXAM: HM Diabetic Foot Exam: NORMAL

## 2014-04-25 LAB — HEMOGLOBIN A1C: Hgb A1c MFr Bld: 9.1 % — AB (ref 4.0–6.0)

## 2014-04-26 ENCOUNTER — Telehealth: Payer: Self-pay

## 2014-04-26 NOTE — Telephone Encounter (Signed)
Called patient to make aware that we no longer have the need to send her Vit D to her place of employment due to our [place] performing this test now.  No answer.

## 2014-04-26 NOTE — Telephone Encounter (Signed)
Closed Encounter  °

## 2014-05-01 NOTE — Telephone Encounter (Signed)
Sent letter

## 2014-05-02 ENCOUNTER — Encounter: Payer: Self-pay | Admitting: General Practice

## 2014-05-02 ENCOUNTER — Telehealth: Payer: Self-pay | Admitting: Family Medicine

## 2014-05-02 DIAGNOSIS — I495 Sick sinus syndrome: Secondary | ICD-10-CM

## 2014-05-02 MED ORDER — NEBIVOLOL HCL 10 MG PO TABS: 10.0000 mg | ORAL_TABLET | Freq: Every day | ORAL | Status: DC

## 2014-05-02 MED ORDER — VALSARTAN-HYDROCHLOROTHIAZIDE 160-25 MG PO TABS: 1.0000 | ORAL_TABLET | Freq: Every day | ORAL | Status: DC

## 2014-05-02 MED ORDER — AMLODIPINE-ATORVASTATIN 10-20 MG PO TABS: 1.0000 | ORAL_TABLET | Freq: Every day | ORAL | Status: DC

## 2014-05-02 NOTE — Telephone Encounter (Signed)
Caller name: Jakeia Strunk Relation to pt: patient Call back number: [phone] Pharmacy: [facility]   Reason for call: patient called to request a refill for Bystolic, Caudet and Valsartin. Please advise.

## 2014-05-02 NOTE — Telephone Encounter (Signed)
lmovm notifying pt.  

## 2014-05-02 NOTE — Telephone Encounter (Signed)
Medication was filled

## 2014-05-28 ENCOUNTER — Other Ambulatory Visit: Payer: Self-pay | Admitting: General Practice

## 2014-05-28 ENCOUNTER — Telehealth: Payer: Self-pay | Admitting: Family Medicine

## 2014-05-28 MED ORDER — METFORMIN HCL 500 MG PO TABS: 500.0000 mg | ORAL_TABLET | Freq: Two times a day (BID) | ORAL | Status: DC

## 2014-05-28 MED ORDER — VALSARTAN-HYDROCHLOROTHIAZIDE 160-25 MG PO TABS: 1.0000 | ORAL_TABLET | Freq: Every day | ORAL | Status: DC

## 2014-05-28 NOTE — Telephone Encounter (Signed)
Pt.notified

## 2014-05-28 NOTE — Telephone Encounter (Signed)
Caller name: Willadean CarolGenetha Relation to ZO:XWRUEAVpt:patient Call back number: [phone] [facility] [address], [place], [place] [id number] [phone]  Reason for call: to request a refill for valsartan-hydrochlorothiazide (DIOVAN-HCT) 160-25 MG per tablet and metformin. Patient stated that she is still in [place] and does not know when she will be back. Please advise

## 2014-05-28 NOTE — Telephone Encounter (Signed)
This med was filled #30 with 2 to out of state pharmacy.

## 2014-05-31 ENCOUNTER — Telehealth: Payer: Self-pay | Admitting: Family Medicine

## 2014-05-31 MED ORDER — AMLODIPINE-ATORVASTATIN 10-20 MG PO TABS: 1.0000 | ORAL_TABLET | Freq: Every day | ORAL | Status: DC

## 2014-05-31 NOTE — Telephone Encounter (Signed)
Caller name: Willadean CarolGenetha  Call back number: [phone] Pharmacy:Caller name: Willadean CarolGenetha  Relation to BJ:YNWGNFApt:patient  Call back number: [phone]  [facility] [address], [place], [place] [id number]  [phone] Valinda HoarFAX [phone]  Reason for call:   Pt is needing the RX amlodipine-atorvastatin (CADUET) sent to this pharmacy listed above too.

## 2014-05-31 NOTE — Telephone Encounter (Signed)
Medication filled.  

## 2014-07-02 ENCOUNTER — Encounter: Payer: Private Health Insurance - Indemnity | Admitting: Cardiovascular Disease

## 2014-07-19 ENCOUNTER — Other Ambulatory Visit: Payer: Self-pay | Admitting: General Practice

## 2014-07-19 MED ORDER — VALSARTAN-HYDROCHLOROTHIAZIDE 160-25 MG PO TABS: 1.0000 | ORAL_TABLET | Freq: Every day | ORAL | Status: DC

## 2014-07-19 MED ORDER — METFORMIN HCL 500 MG PO TABS: 500.0000 mg | ORAL_TABLET | Freq: Two times a day (BID) | ORAL | Status: DC

## 2014-07-24 ENCOUNTER — Ambulatory Visit: Payer: Private Health Insurance - Indemnity | Admitting: Family

## 2014-07-24 ENCOUNTER — Telehealth: Payer: Self-pay | Admitting: Family Medicine

## 2014-07-24 NOTE — Telephone Encounter (Signed)
Acute- no show- urine smells, fell, feels bad

## 2014-07-24 NOTE — Telephone Encounter (Signed)
Please call pt to see if sxs resolved/improved.  If not, needs appt

## 2014-07-24 NOTE — Telephone Encounter (Signed)
Called pt and lmovm to return call.

## 2014-07-25 ENCOUNTER — Ambulatory Visit: Payer: Private Health Insurance - Indemnity | Admitting: Physician Assistant

## 2014-07-30 ENCOUNTER — Other Ambulatory Visit: Payer: Self-pay | Admitting: Family Medicine

## 2014-07-31 NOTE — Telephone Encounter (Signed)
Med filled.  

## 2014-08-06 ENCOUNTER — Other Ambulatory Visit: Payer: Self-pay | Admitting: General Practice

## 2014-08-06 MED ORDER — AMLODIPINE-ATORVASTATIN 10-20 MG PO TABS: ORAL_TABLET | ORAL | Status: DC

## 2014-08-12 ENCOUNTER — Other Ambulatory Visit: Payer: Self-pay | Admitting: General Practice

## 2014-08-12 MED ORDER — AMLODIPINE-ATORVASTATIN 10-20 MG PO TABS: ORAL_TABLET | ORAL | Status: DC

## 2014-08-14 ENCOUNTER — Telehealth: Payer: Self-pay | Admitting: *Deleted

## 2014-08-14 ENCOUNTER — Ambulatory Visit: Payer: Private Health Insurance - Indemnity | Admitting: Family Medicine

## 2014-08-14 DIAGNOSIS — Z0289 Encounter for other administrative examinations: Secondary | ICD-10-CM

## 2014-08-14 NOTE — Telephone Encounter (Signed)
Pt did not show for appointment 08/14/2014 at 10:45 for dizzy for past week,offered earlier appt

## 2014-08-14 NOTE — Telephone Encounter (Signed)
Pt needs no-show fee 

## 2014-08-20 ENCOUNTER — Encounter: Payer: Private Health Insurance - Indemnity | Admitting: Cardiovascular Disease

## 2014-08-29 ENCOUNTER — Encounter: Payer: Self-pay | Admitting: Family Medicine

## 2014-08-29 ENCOUNTER — Ambulatory Visit (INDEPENDENT_AMBULATORY_CARE_PROVIDER_SITE_OTHER): Payer: Private Health Insurance - Indemnity | Admitting: Family Medicine

## 2014-08-29 ENCOUNTER — Telehealth: Payer: Self-pay | Admitting: Family Medicine

## 2014-08-29 VITALS — BP 130/80 | HR 96 | Temp 98.0°F | Resp 16 | Wt 256.4 lb

## 2014-08-29 DIAGNOSIS — I495 Sick sinus syndrome: Secondary | ICD-10-CM

## 2014-08-29 DIAGNOSIS — I10 Essential (primary) hypertension: Secondary | ICD-10-CM

## 2014-08-29 DIAGNOSIS — R319 Hematuria, unspecified: Secondary | ICD-10-CM

## 2014-08-29 DIAGNOSIS — E119 Type 2 diabetes mellitus without complications: Secondary | ICD-10-CM

## 2014-08-29 DIAGNOSIS — N390 Urinary tract infection, site not specified: Secondary | ICD-10-CM

## 2014-08-29 DIAGNOSIS — R8290 Unspecified abnormal findings in urine: Secondary | ICD-10-CM | POA: Insufficient documentation

## 2014-08-29 DIAGNOSIS — R82998 Other abnormal findings in urine: Secondary | ICD-10-CM

## 2014-08-29 LAB — BASIC METABOLIC PANEL
BUN: 19 mg/dL (ref 6–23)
CALCIUM: 10.1 mg/dL (ref 8.4–10.5)
CO2: 27 mEq/L (ref 19–32)
Chloride: 103 mEq/L (ref 96–112)
Creatinine, Ser: 0.8 mg/dL (ref 0.4–1.2)
GFR: 92.59 mL/min (ref >60.00)
Glucose, Bld: 184 mg/dL — ABNORMAL HIGH (ref 70–99)
POTASSIUM: 3.5 meq/L (ref 3.5–5.1)
SODIUM: 137 meq/L (ref 135–145)

## 2014-08-29 LAB — POCT URINALYSIS DIPSTICK
BILIRUBIN UA: NEGATIVE
Glucose, UA: NEGATIVE
Nitrite, UA: NEGATIVE
SPEC GRAV UA: 1.02
Urobilinogen, UA: 0.2
pH, UA: 6

## 2014-08-29 LAB — HEMOGLOBIN A1C: Hgb A1c MFr Bld: 9.6 % — ABNORMAL HIGH (ref 4.6–6.5)

## 2014-08-29 LAB — TSH: TSH: 2.29 u[IU]/mL (ref 0.35–4.50)

## 2014-08-29 MED ORDER — CEPHALEXIN 500 MG PO CAPS: 500.0000 mg | ORAL_CAPSULE | Freq: Two times a day (BID) | ORAL | Status: DC

## 2014-08-29 MED ORDER — INSULIN GLARGINE 100 UNIT/ML ~~LOC~~ SOLN: 32.0000 [IU] | Freq: Every day | SUBCUTANEOUS | Status: DC

## 2014-08-29 MED ORDER — NEBIVOLOL HCL 10 MG PO TABS: 10.0000 mg | ORAL_TABLET | Freq: Every day | ORAL | Status: DC

## 2014-08-29 MED ORDER — VALSARTAN-HYDROCHLOROTHIAZIDE 160-25 MG PO TABS: 1.0000 | ORAL_TABLET | Freq: Every day | ORAL | Status: DC

## 2014-08-29 NOTE — Telephone Encounter (Signed)
Med filled.  

## 2014-08-29 NOTE — Telephone Encounter (Signed)
° °  Caller name: Willadean CarolGenetha  Relation to pt: self  Call back number: [phone] [facility] [phone]   Reason for call:    insulin glargine (LANTUS) 100 UNIT/ML injection and nebivolol (BYSTOLIC) 10 MG tablet and valsartan-hydrochlorothiazide (DIOVAN-HCT) 160-25 MG per 90 day supply

## 2014-08-29 NOTE — Progress Notes (Signed)
   Subjective:    Patient ID: Lauren Wilkins, female    DOB: [birth year], 58 y.o.   MRN: [id number]  HPI DM- chronic problem, on Metformin/Lantus.  Due for labs.  CBGs are labile.  On ARB for renal protection.  Walking regularly.  UTD on eye exam.  No neuropathy.  Denies symptomatic lows.  HTN- chronic problem, on Amlodipine, Bystolic, Diovan-HCT.  Adequate control today.  No CP, SOB, HAs, visual changes, edema.  Urine odor- pt reports 'it smells acid-y'.  No burning w/ urination.  Odor started ~2.5 weeks ago.  Improves w/ adequate hydration but returns.  No new meds.   Review of Systems For ROS see HPI     Objective:   Physical Exam  Vitals reviewed. Constitutional: She is oriented to person, place, and time. She appears well-developed and well-nourished. No distress.  HENT:  Head: Normocephalic and atraumatic.  Eyes: Conjunctivae and EOM are normal. Pupils are equal, round, and reactive to light.  Neck: Normal range of motion. Neck supple. No thyromegaly present.  Cardiovascular: Normal rate, regular rhythm, normal heart sounds and intact distal pulses.   No murmur heard. Pulmonary/Chest: Effort normal and breath sounds normal. No respiratory distress.  Abdominal: Soft. She exhibits no distension. There is no tenderness.  Musculoskeletal: She exhibits no edema.  Lymphadenopathy:    She has no cervical adenopathy.  Neurological: She is alert and oriented to person, place, and time.  Skin: Skin is warm and dry.  Psychiatric: She has a normal mood and affect. Her behavior is normal.          Assessment & Plan:

## 2014-08-29 NOTE — Assessment & Plan Note (Signed)
New.  Pt's urine suspicious for UA.  Start abx.  Await urine cx results.  Reviewed supportive care and red flags that should prompt return.  Pt expressed understanding and is in agreement w/ plan.

## 2014-08-29 NOTE — Assessment & Plan Note (Signed)
Chronic problem.  Adequate control.  Asymptomatic.  Check labs.  No anticipated med changes 

## 2014-08-29 NOTE — Progress Notes (Signed)
Pre visit review using our clinic review tool, if applicable. No additional management support is needed unless otherwise documented below in the visit note. 

## 2014-08-29 NOTE — Assessment & Plan Note (Addendum)
Pt reports following w/ Dr Sharl MaKerr.  UTD on eye exam.  On ARB for renal protection.  Due to pt's urinary odor, will check labs to r/o elevated sugars which would make infxn more likely.  Will fax results to Dr Sharl MaKerr

## 2014-08-29 NOTE — Patient Instructions (Signed)
Follow up in 3-4 months to recheck diabetes We'll notify you of your lab results and make any changes if needed Start the Keflex twice daily for the UTI Drink plenty of fluids Call with any questions or concerns Hang in there!!!

## 2014-08-30 ENCOUNTER — Encounter: Payer: Self-pay | Admitting: General Practice

## 2014-08-30 ENCOUNTER — Telehealth: Payer: Self-pay

## 2014-08-30 LAB — URINE CULTURE
Colony Count: NO GROWTH
ORGANISM ID, BACTERIA: NO GROWTH

## 2014-08-30 MED ORDER — INSULIN GLARGINE 100 UNIT/ML SOLOSTAR PEN: 32.0000 [IU] | PEN_INJECTOR | Freq: Every day | SUBCUTANEOUS | Status: DC

## 2014-08-30 NOTE — Telephone Encounter (Signed)
Medication resent as the pen. This was not on her original medication list.

## 2014-08-30 NOTE — Addendum Note (Signed)
Addended by: Jackson LatinoYLER, JESSICA L on: 08/30/2014 04:26 PM   Modules accepted: Orders

## 2014-08-30 NOTE — Telephone Encounter (Signed)
Patient states that the pharmacy does not have the corrected rx. Please resend.

## 2014-08-30 NOTE — Telephone Encounter (Signed)
Med resent again at 4:26pm.

## 2014-08-30 NOTE — Telephone Encounter (Signed)
Andre LefortGenetha Modi [phone] [facility] [place]  [place] called to say she needs the Lantus pen called in, instead of the vial.

## 2014-09-02 ENCOUNTER — Other Ambulatory Visit: Payer: Self-pay | Admitting: Family Medicine

## 2014-09-02 DIAGNOSIS — E1165 Type 2 diabetes mellitus with hyperglycemia: Secondary | ICD-10-CM

## 2014-09-02 DIAGNOSIS — IMO0002 Reserved for concepts with insufficient information to code with codable children: Secondary | ICD-10-CM

## 2014-09-02 NOTE — Telephone Encounter (Signed)
PA initiated on [email or web address]. Awaiting determination. JG//CMA 

## 2014-09-02 NOTE — Telephone Encounter (Signed)
Can you please work on this PA

## 2014-09-02 NOTE — Telephone Encounter (Signed)
Caller name: Andre LefortGreene, Zain Relation to pt: self  Call back number: [phone] [facility] [phone]   Reason for call:   pt states she spoke with pharmacy and they did not receive Insulin Glargine (LANTUS SOLOSTAR) 100 UNIT/ML Solostar Pen  In the pen form 90 day supply do to insurance coverage. Please advise

## 2014-09-03 ENCOUNTER — Ambulatory Visit: Payer: Private Health Insurance - Indemnity | Admitting: Cardiovascular Disease

## 2014-09-06 ENCOUNTER — Emergency Department (HOSPITAL_BASED_OUTPATIENT_CLINIC_OR_DEPARTMENT_OTHER)
Admission: EM | Admit: 2014-09-06 | Discharge: 2014-09-07 | Disposition: A | Discharge status: Home / Self Care | Payer: Private Health Insurance - Indemnity | Attending: Emergency Medicine | Admitting: Emergency Medicine

## 2014-09-06 ENCOUNTER — Emergency Department (HOSPITAL_BASED_OUTPATIENT_CLINIC_OR_DEPARTMENT_OTHER): Payer: Private Health Insurance - Indemnity

## 2014-09-06 ENCOUNTER — Encounter (HOSPITAL_BASED_OUTPATIENT_CLINIC_OR_DEPARTMENT_OTHER): Payer: Self-pay | Admitting: Emergency Medicine

## 2014-09-06 DIAGNOSIS — I10 Essential (primary) hypertension: Secondary | ICD-10-CM | POA: Diagnosis not present

## 2014-09-06 DIAGNOSIS — E119 Type 2 diabetes mellitus without complications: Secondary | ICD-10-CM | POA: Insufficient documentation

## 2014-09-06 DIAGNOSIS — R002 Palpitations: Secondary | ICD-10-CM | POA: Diagnosis present

## 2014-09-06 DIAGNOSIS — R42 Dizziness and giddiness: Secondary | ICD-10-CM | POA: Insufficient documentation

## 2014-09-06 DIAGNOSIS — Z79899 Other long term (current) drug therapy: Secondary | ICD-10-CM | POA: Insufficient documentation

## 2014-09-06 DIAGNOSIS — Z950 Presence of cardiac pacemaker: Secondary | ICD-10-CM | POA: Insufficient documentation

## 2014-09-06 DIAGNOSIS — R51 Headache: Secondary | ICD-10-CM | POA: Diagnosis not present

## 2014-09-06 DIAGNOSIS — Z794 Long term (current) use of insulin: Secondary | ICD-10-CM | POA: Insufficient documentation

## 2014-09-06 LAB — CBC WITH DIFFERENTIAL/PLATELET
Basophils Absolute: 0 10*3/uL (ref 0.0–0.1)
Basophils Relative: 0 % (ref 0–1)
EOS ABS: 0.1 10*3/uL (ref 0.0–0.7)
EOS PCT: 1 % (ref 0–5)
HEMATOCRIT: 41.2 % (ref 36.0–46.0)
HEMOGLOBIN: 14 g/dL (ref 12.0–15.0)
LYMPHS ABS: 2 10*3/uL (ref 0.7–4.0)
Lymphocytes Relative: 29 % (ref 12–46)
MCH: 27.2 pg (ref 26.0–34.0)
MCHC: 34 g/dL (ref 30.0–36.0)
MCV: 80.2 fL (ref 78.0–100.0)
MONO ABS: 0.5 10*3/uL (ref 0.1–1.0)
MONOS PCT: 7 % (ref 3–12)
Neutro Abs: 4.5 10*3/uL (ref 1.7–7.7)
Neutrophils Relative %: 63 % (ref 43–77)
Platelets: 287 10*3/uL (ref 150–400)
RBC: 5.14 MIL/uL — AB (ref 3.87–5.11)
RDW: 15 % (ref 11.5–15.5)
WBC: 7 10*3/uL (ref 4.0–10.5)

## 2014-09-06 LAB — COMPREHENSIVE METABOLIC PANEL
ALT: 56 U/L — ABNORMAL HIGH (ref 0–35)
ANION GAP: 14 (ref 5–15)
AST: 40 U/L — ABNORMAL HIGH (ref 0–37)
Albumin: 3.9 g/dL (ref 3.5–5.2)
Alkaline Phosphatase: 146 U/L — ABNORMAL HIGH (ref 39–117)
BUN: 18 mg/dL (ref 6–23)
CALCIUM: 10.2 mg/dL (ref 8.4–10.5)
CO2: 23 meq/L (ref 19–32)
Chloride: 105 mEq/L (ref 96–112)
Creatinine, Ser: 1 mg/dL (ref 0.50–1.10)
GFR, EST AFRICAN AMERICAN: 72 mL/min — AB (ref >90)
GFR, EST NON AFRICAN AMERICAN: 62 mL/min — AB (ref >90)
GLUCOSE: 156 mg/dL — AB (ref 70–99)
Potassium: 3.5 mEq/L — ABNORMAL LOW (ref 3.7–5.3)
Sodium: 142 mEq/L (ref 137–147)
Total Bilirubin: 0.4 mg/dL (ref 0.3–1.2)
Total Protein: 7.8 g/dL (ref 6.0–8.3)

## 2014-09-06 LAB — TROPONIN I: Troponin I: <0.3 ng/mL (ref <0.30)

## 2014-09-06 MED ORDER — POTASSIUM CHLORIDE CRYS ER 20 MEQ PO TBCR
40.0000 meq | EXTENDED_RELEASE_TABLET | Freq: Once | ORAL | Status: AC
  Administered 2014-09-06: 40 meq via ORAL
  Filled 2014-09-06: qty 2

## 2014-09-06 MED ORDER — SODIUM CHLORIDE 0.9 % IV BOLUS (SEPSIS)
1000.0000 mL | INTRAVENOUS | Status: AC
  Administered 2014-09-06: 1000 mL via INTRAVENOUS

## 2014-09-06 NOTE — ED Notes (Signed)
Pt states was getting ready for work felt like heart was racing,  Ha and felt like was going to pass out  Lasted about 20 minutes   C/o weakness now  Denies pain

## 2014-09-06 NOTE — ED Provider Notes (Signed)
CSN: [id number]     Arrival date & time 09/06/14  2031 History  This chart was scribed for Lauren SheffieldForrest Kaveh Kissinger, MD by Elon SpannerGarrett Cook, ED Scribe. This patient was seen in room MH03/MH03 and the patient's care was started at 8:47 PM.   Chief Complaint  Patient presents with  . Palpitations   Patient is a 58 y.o. female presenting with palpitations. The history is provided by the patient. No language interpreter was used.  Palpitations Onset quality:  Sudden Duration:  30 minutes Timing:  Constant Progression:  Resolved Chronicity:  New Relieved by:  Nothing Worsened by:  Nothing tried Ineffective treatments:  None tried Associated symptoms: no back pain, no chest pain, no cough and no shortness of breath   HPI Comments: Lauren LefortGenetha Wilkins is a 58 y.o. female who presents to the Emergency Department complaining of currently resolved heart palpitations.  Patient reports she woke this evening to go to work and took a shower.  She states she was brushing her teeth when her heart "began racing" and she became lightheaded and lied down to prevent herself from falling.  She also reports an episode of diarrhea during the incident.  Patient reports she had a headache for approximately 30 minutes that dissipated with the palpitations. Patient denies eating/drinking normally She denies LOC, syncope.  Patient reports she has a history of syncope 10 years ago after which she had a [employer] pacemaker implanted.  Patient denies history of CHF.  Patient denies CP, SOB.   Past Medical History  Diagnosis Date  . Diabetes mellitus   . Hypertension   . Chest pressure, resolved 01/22/2012  . DM (diabetes mellitus) 01/22/2012  . Hypokalemia 01/22/2012  . Dysrhythmia   . Pacemaker    Past Surgical History  Procedure Laterality Date  . Pacemaker insertion    . Abdominal hysterectomy    . Hernia repair    . Insert / replace / remove pacemaker     Family History  Problem Relation Age of Onset  . Coronary artery disease  Father   . Alzheimer's disease Mother   . Diabetes Sister   . Arthritis Sister   . Cancer Sister     breast cancer  . Breast cancer Mother    History  Substance Use Topics  . Smoking status: Never Smoker   . Smokeless tobacco: Never Used  . Alcohol Use: No   OB History   Grav Para Term Preterm Abortions TAB SAB Ect Mult Living                 Review of Systems  Constitutional: Negative for appetite change and fatigue.  HENT: Negative for congestion, ear discharge and sinus pressure.   Eyes: Negative for discharge.  Respiratory: Negative for cough and shortness of breath.   Cardiovascular: Positive for palpitations. Negative for chest pain.  Gastrointestinal: Negative for abdominal pain and diarrhea.  Genitourinary: Negative for frequency and hematuria.  Musculoskeletal: Negative for back pain.  Skin: Negative for rash.  Neurological: Positive for light-headedness and headaches. Negative for seizures and syncope.  Psychiatric/Behavioral: Negative for hallucinations.      Allergies  Review of patient's allergies indicates no known allergies.  Home Medications   Prior to Admission medications   Medication Sig Start Date End Date Taking? Authorizing Provider  ACCU-CHEK FASTCLIX LANCETS MISC  01/22/14   Historical Provider, MD  amlodipine-atorvastatin (CADUET) 10-20 MG per tablet TAKE 1 TABLET DAILY FOR BLOOD PRESSURE 08/12/14   Sheliah HatchKatherine E Tabori, MD  Insulin  Glargine (LANTUS SOLOSTAR) 100 UNIT/ML Solostar Pen Inject 32 Units into the skin daily. 08/30/14   Sheliah HatchKatherine E Tabori, MD  metFORMIN (GLUCOPHAGE) 500 MG tablet Take 1 tablet (500 mg total) by mouth 2 (two) times daily with a meal. 07/19/14   Sheliah HatchKatherine E Tabori, MD  nebivolol (BYSTOLIC) 10 MG tablet Take 1 tablet (10 mg total) by mouth at bedtime. 08/29/14   Sheliah HatchKatherine E Tabori, MD  [facility] test strip Pt tests sugars twice daily. 08/01/13   Historical Provider, MD   BP 168/109  Pulse 96  Temp(Src) 98 F (36.7 C)  (Oral)  Resp 18  Ht 5\' 6"  (1.676 m)  Wt 255 lb (115.667 kg)  BMI 41.18 kg/m2  SpO2 100% Physical Exam  Nursing note and vitals reviewed. Constitutional: She is oriented to person, place, and time. She appears well-developed and well-nourished.  Non-toxic appearance. She does not appear ill. No distress.  HENT:  Head: Normocephalic and atraumatic.  Right Ear: External ear normal.  Left Ear: External ear normal.  Nose: Nose normal. No mucosal edema or rhinorrhea.  Mouth/Throat: Oropharynx is clear and moist and mucous membranes are normal. No dental abscesses or uvula swelling.  Eyes: Conjunctivae and EOM are normal. Pupils are equal, round, and reactive to light.  Neck: Normal range of motion and full passive range of motion without pain. Neck supple.  Cardiovascular: Normal rate, regular rhythm and normal heart sounds.  Exam reveals no gallop and no friction rub.   No murmur heard. Pulmonary/Chest: Effort normal and breath sounds normal. No respiratory distress. She has no wheezes. She has no rhonchi. She has no rales. She exhibits no tenderness and no crepitus.  Abdominal: Soft. Normal appearance and bowel sounds are normal. She exhibits no distension. There is no tenderness. There is no rebound and no guarding.  Musculoskeletal: Normal range of motion. She exhibits no edema and no tenderness.  Moves all extremities well.   Neurological: She is alert and oriented to person, place, and time. She has normal strength. No cranial nerve deficit.  alert, oriented x3 speech: normal in context and clarity memory: intact grossly cranial nerves II-XII: intact motor strength: full proximally and distally no involuntary movements or tremors sensation: intact to light touch diffusely  cerebellar: finger-to-nose and heel-to-shin intact gait: normal forwards and backwards  Skin: Skin is warm, dry and intact. No rash noted. No erythema. No pallor.  Psychiatric: She has a normal mood and affect.  Her speech is normal and behavior is normal. Her mood appears not anxious.    ED Course  Procedures (including critical care time)  DIAGNOSTIC STUDIES: Oxygen Saturation is 100% on RA, normal by my interpretation.    COORDINATION OF CARE:  8:54 PM   Labs Review Labs Reviewed  CBC WITH DIFFERENTIAL - Abnormal; Notable for the following:    RBC 5.14 (*)    All other components within normal limits  COMPREHENSIVE METABOLIC PANEL - Abnormal; Notable for the following:    Potassium 3.5 (*)    Glucose, Bld 156 (*)    AST 40 (*)    ALT 56 (*)    Alkaline Phosphatase 146 (*)    GFR calc non Af Amer 62 (*)    GFR calc Af Amer 72 (*)    All other components within normal limits  TROPONIN I    Imaging Review Dg Chest 2 View  09/06/2014   CLINICAL DATA:  58 year old female with tachycardia. Diabetes and high blood pressure. Initial encounter.  EXAM:  CHEST  2 VIEW  COMPARISON:  11/21/2012.  FINDINGS: Sequential pacemaker is in place entering from left with leads unchanged in position.  Heart size top-normal.  Central pulmonary vascular prominence without pulmonary edema.  No segmental infiltrate or pneumothorax.  Mildly tortuous aorta.  IMPRESSION: Pacemaker in place without evidence of pulmonary edema.  No segmental consolidation.  Mildly tortuous aorta.   Electronically Signed   By: Bridgett Larsson M.D.   On: 09/06/2014 21:27     EKG Interpretation   Date/Time:  Friday September 06 2014 20:45:10 EDT Ventricular Rate:  100 PR Interval:  154 QRS Duration: 84 QT Interval:  340 QTC Calculation: 438 R Axis:   -22 Text Interpretation:  Normal sinus rhythm Minimal voltage criteria for  LVH, may be normal variant Borderline ECG No significant change since last  tracing Confirmed by Kingstyn Deruiter  MD, Neena Beecham ([id number]) on 09/06/2014 8:44:48 PM      MDM   Final diagnoses:  Palpitations    11:19 PM 58 y.o. female w a hx of DM, HTN, pacemaker, PE who presents with palpitations. She denies  cp/sob. She is not sure why she had a pacemaker placed and I cannot find the dysrhythmia that she had in review of the notes. She is currently asymptomatic. Her vital signs are unremarkable here. Will get screening labs/imaging/IVF.   11:19 PM: The patient remains asymptomatic and is feeling much better. I have discussed the diagnosis/risks/treatment options with the patient and believe the pt to be eligible for discharge home to follow-up with her cardiologist next week. We also discussed returning to the ED immediately if new or worsening sx occur. We discussed the sx which are most concerning (e.g., return of palpitations, sob, cp) that necessitate immediate return. Medications administered to the patient during their visit and any new prescriptions provided to the patient are listed below.  Medications given during this visit Medications  potassium chloride SA (K-DUR,KLOR-CON) CR tablet 40 mEq (not administered)  sodium chloride 0.9 % bolus 1,000 mL (1,000 mLs Intravenous New Bag/Given 09/06/14 2119)    New Prescriptions   No medications on file      I personally performed the services described in this documentation, which was scribed in my presence. The recorded information has been reviewed and is accurate.    Lauren [place], MD 09/07/14 [phone]

## 2014-09-06 NOTE — ED Notes (Signed)
Pt reports that when she stood up tonight, she felt her heart racing.  Denies SOB, N/V.  States that her pacemaker was placed 10 years ago.  Drover herself here, no distress at this time.

## 2014-09-09 ENCOUNTER — Other Ambulatory Visit: Payer: Self-pay | Admitting: General Practice

## 2014-09-09 MED ORDER — METFORMIN HCL 500 MG PO TABS: 500.0000 mg | ORAL_TABLET | Freq: Two times a day (BID) | ORAL | Status: DC

## 2014-09-09 NOTE — Telephone Encounter (Signed)
Pt checking the status of her 90 day supply INSULIN GLARGINE 100 UNIT/ML SOLOSTAR PEN.

## 2014-09-10 ENCOUNTER — Ambulatory Visit (INDEPENDENT_AMBULATORY_CARE_PROVIDER_SITE_OTHER): Payer: Private Health Insurance - Indemnity | Admitting: *Deleted

## 2014-09-10 DIAGNOSIS — Z950 Presence of cardiac pacemaker: Secondary | ICD-10-CM

## 2014-09-10 DIAGNOSIS — I455 Other specified heart block: Secondary | ICD-10-CM

## 2014-09-10 LAB — MDC_IDC_ENUM_SESS_TYPE_INCLINIC
Battery Impedance: 1700 Ohm
Battery Voltage: 2.76 V
Brady Statistic RA Percent Paced: 1 % — CL
Brady Statistic RV Percent Paced: 1 % — CL
Date Time Interrogation Session: 20151020092916
Implantable Pulse Generator Model: 5356
Implantable Pulse Generator Serial Number: 1357232
Lead Channel Impedance Value: 454 Ohm
Lead Channel Impedance Value: 473 Ohm
Lead Channel Pacing Threshold Amplitude: 0.75 V
Lead Channel Pacing Threshold Amplitude: 1.5 V
Lead Channel Pacing Threshold Pulse Width: 0.4 ms
Lead Channel Pacing Threshold Pulse Width: 0.6 ms
Lead Channel Sensing Intrinsic Amplitude: 10.3 mV
Lead Channel Sensing Intrinsic Amplitude: 2.9 mV
Lead Channel Setting Pacing Amplitude: 2 V
Lead Channel Setting Pacing Amplitude: 3 V
Lead Channel Setting Pacing Pulse Width: 0.6 ms
Lead Channel Setting Sensing Sensitivity: 2 mV

## 2014-09-10 NOTE — Telephone Encounter (Signed)
Additional information faxed to [facility]. Awaiting response/determination. JG//CMA

## 2014-09-10 NOTE — Progress Notes (Signed)
Pacemaker check in clinic. Pt c/o fluttering sensations last night. Normal device function. Thresholds, sensing, impedances consistent with previous measurements. Device programmed to maximize longevity. No mode switch or high ventricular rates noted. Device programmed at appropriate safety margins---changed RV output from 2.5 to 3.0V. Histogram distribution appropriate for patient activity level. Device programmed to optimize intrinsic conduction. Estimated longevity 3-7.9363yrs. ROV w/ Dr. Royann Shiversroitoru 10/30/14.

## 2014-09-11 MED ORDER — INSULIN GLARGINE 100 UNIT/ML SOLOSTAR PEN: 32.0000 [IU] | PEN_INJECTOR | Freq: Every day | SUBCUTANEOUS | Status: DC

## 2014-09-11 NOTE — Telephone Encounter (Signed)
I don't know how many pens to a box.  We can call script to pharmacy and ask to dispense 2 boxes.  Also, pt was to have Endo referral entered after last visit.  They will also be able to adjust her insulin.

## 2014-09-11 NOTE — Telephone Encounter (Signed)
Referral placed.

## 2014-09-11 NOTE — Telephone Encounter (Signed)
Per pharmacy there are 5 pens in a box. I filled meds for 2 boxes (10pens)

## 2014-09-11 NOTE — Telephone Encounter (Signed)
i am not sure how to order this. I do not know how many pens come in a box. Do you?

## 2014-09-11 NOTE — Telephone Encounter (Signed)
Caller name: [facility]  Call back number: [phone] Pharmacy: [facility]  Reason for call:  To make pt's rx Insulin Glargine (LANTUS SOLOSTAR) 100 UNIT/ML Solostar Pen to change quantity to 2 boxes to make it 90 day supply.

## 2014-09-20 ENCOUNTER — Encounter: Payer: Self-pay | Admitting: General Practice

## 2014-09-23 ENCOUNTER — Encounter: Payer: Self-pay | Admitting: Cardiovascular Disease

## 2014-09-26 ENCOUNTER — Telehealth: Payer: Self-pay | Admitting: Cardiovascular Disease

## 2014-09-26 NOTE — Telephone Encounter (Signed)
Records from [facility] [place] received for appointment on 12.9.15 with M Croitoru: given to Nenita in HIM department:djc

## 2014-10-28 ENCOUNTER — Telehealth: Payer: Self-pay | Admitting: *Deleted

## 2014-10-28 ENCOUNTER — Ambulatory Visit: Payer: Private Health Insurance - Indemnity | Admitting: Family Medicine

## 2014-10-28 NOTE — Telephone Encounter (Signed)
Pt did not show for appointment 10/28/2014 at 8:30am for 6 month follow up

## 2014-10-28 NOTE — Telephone Encounter (Signed)
Pt needs no-show fee and to reschedule 

## 2014-10-29 ENCOUNTER — Encounter: Payer: Private Health Insurance - Indemnity | Admitting: Cardiovascular Disease

## 2014-10-29 NOTE — Telephone Encounter (Signed)
See note below

## 2014-10-30 ENCOUNTER — Encounter: Payer: Private Health Insurance - Indemnity | Admitting: Cardiovascular Disease

## 2014-11-05 ENCOUNTER — Encounter: Payer: Self-pay | Admitting: Medical

## 2014-11-05 ENCOUNTER — Ambulatory Visit (INDEPENDENT_AMBULATORY_CARE_PROVIDER_SITE_OTHER): Payer: Private Health Insurance - Indemnity | Admitting: Medical

## 2014-11-05 ENCOUNTER — Telehealth: Payer: Self-pay | Admitting: Medical

## 2014-11-05 VITALS — BP 124/86 | HR 86 | Temp 97.7°F | Ht 65.0 in | Wt 251.0 lb

## 2014-11-05 DIAGNOSIS — R350 Frequency of micturition: Secondary | ICD-10-CM | POA: Insufficient documentation

## 2014-11-05 DIAGNOSIS — R5383 Other fatigue: Secondary | ICD-10-CM

## 2014-11-05 LAB — COMPREHENSIVE METABOLIC PANEL
ALBUMIN: 4.3 g/dL (ref 3.5–5.2)
ALK PHOS: 122 U/L — AB (ref 39–117)
ALT: 31 U/L (ref 0–35)
AST: 28 U/L (ref 0–37)
BUN: 26 mg/dL — AB (ref 6–23)
CHLORIDE: 103 meq/L (ref 96–112)
CO2: 23 mEq/L (ref 19–32)
Calcium: 10.3 mg/dL (ref 8.4–10.5)
Creatinine, Ser: 1.1 mg/dL (ref 0.4–1.2)
GFR: 66.62 mL/min (ref >60.00)
GLUCOSE: 163 mg/dL — AB (ref 70–99)
Potassium: 3.7 mEq/L (ref 3.5–5.1)
Sodium: 135 mEq/L (ref 135–145)
TOTAL PROTEIN: 7.9 g/dL (ref 6.0–8.3)
Total Bilirubin: 0.3 mg/dL (ref 0.2–1.2)

## 2014-11-05 LAB — CBC WITH DIFFERENTIAL/PLATELET
BASOS PCT: 0.4 % (ref 0.0–3.0)
Basophils Absolute: 0 10*3/uL (ref 0.0–0.1)
EOS PCT: 1.2 % (ref 0.0–5.0)
Eosinophils Absolute: 0.1 10*3/uL (ref 0.0–0.7)
HCT: 45.6 % (ref 36.0–46.0)
Hemoglobin: 14.7 g/dL (ref 12.0–15.0)
LYMPHS PCT: 43.6 % (ref 12.0–46.0)
Lymphs Abs: 3.1 10*3/uL (ref 0.7–4.0)
MCHC: 32.3 g/dL (ref 30.0–36.0)
MCV: 83.2 fl (ref 78.0–100.0)
MONOS PCT: 5 % (ref 3.0–12.0)
Monocytes Absolute: 0.4 10*3/uL (ref 0.1–1.0)
NEUTROS PCT: 49.8 % (ref 43.0–77.0)
Neutro Abs: 3.6 10*3/uL (ref 1.4–7.7)
PLATELETS: 295 10*3/uL (ref 150.0–400.0)
RBC: 5.49 Mil/uL — AB (ref 3.87–5.11)
RDW: 15.9 % — ABNORMAL HIGH (ref 11.5–15.5)
WBC: 7.2 10*3/uL (ref 4.0–10.5)

## 2014-11-05 LAB — POCT URINALYSIS DIPSTICK
Glucose, UA: NEGATIVE
Ketones, UA: NEGATIVE
NITRITE UA: NEGATIVE
PROTEIN UA: 30
Spec Grav, UA: >=1.03
UROBILINOGEN UA: 1
pH, UA: 5

## 2014-11-05 LAB — TSH: TSH: 2.46 u[IU]/mL (ref 0.35–4.50)

## 2014-11-05 MED ORDER — NITROFURANTOIN MONOHYD MACRO 100 MG PO CAPS: 100.0000 mg | ORAL_CAPSULE | Freq: Two times a day (BID) | ORAL | Status: DC

## 2014-11-05 NOTE — Assessment & Plan Note (Signed)
Due to your recent frequency of urination, we got a ua today and sent your urine out for culture. I prescribed you macrobid antibiotic pending urine culture results

## 2014-11-05 NOTE — Progress Notes (Signed)
Pre visit review using our clinic review tool, if applicable. No additional management support is needed unless otherwise documented below in the visit note. 

## 2014-11-05 NOTE — Telephone Encounter (Signed)
I called and advised pt regarding her k is normal. No anemia, normal wbc and normal tsh. I did discuss with her her symptoms around her reported fatigue. She denies any tachyardia. She speculates that he pulse may have been low at time of fatigue 2 days ago. I asked her to get otc bp cuff and check bp and pulse particularly if she feels fatigued again. She has pacemaker and that was checked in October. I also asked her to be seen same day if she gets any recuurent profound fatigued again. She agreed she would.

## 2014-11-05 NOTE — Patient Instructions (Addendum)
Your severe fatigue episode on Sunday requires some lab work including cbc, cmp and tsh. Also I want you keep check on your bs levels for any highs or lows. If you experience any profound fatigue recommend checking bs at that time as well as blood pressure and pulse. This would be first line investigation you can do at home and by yourself. So I do recommend you go ahead and buy otc electronic cuff.  Due to your recent frequency of urination, we got a ua today and sent your urine out for culture. I prescribed you macrobid antibiotic pending urine culture results.  Presently rest and hydrate. If you feel profound weakness as you describe the other day then I would recommend ED evaluation and not waiting prolonged period.  Follow up in 3 weeks or as needed.   Pt did not report to me any tachycardia.(noticed in chart after pt left that lpn put reason for visit tachycardia?)She has history of pacemaker and looks like cardiologist checked this in 08-2014. Everything was ok with pacemaker. When I get back the labs I and call with results will ask patient to clarify if she felt heart race. Will ask her to check pulse with bp cuff. Can offer to refer her back to her cardiologist.

## 2014-11-05 NOTE — Assessment & Plan Note (Signed)
Your severe fatigue episode on Sunday requires some lab work including cbc, cmp and tsh. Also I want you keep check on your bs levels for any highs or lows. If you experience any profound fatigue recommend checking bs at that time as well as blood pressure and pulse. This would be first line investigation you can do at home and by yourself. So I do recommend you go ahead and buy otc electronic cuff.

## 2014-11-05 NOTE — Progress Notes (Signed)
Subjective:    Patient ID: Lauren LefortGenetha Wilkins, female    DOB: May 09, 1956, 58 y.o.   MRN: [id number]  HPI   Pt states on Sunday had fatigue and she called 911. Pt was confused. She [place]. She elevated her legs and felt better. By the time EMS found her she felt ok. Pt states ems  Checked her bs, bp, pulse and all were normal. Since she was feelling fine by the time they found her. Also important to note she had no syncope.  Pt states she is on valsartan hctz. But this is not on her active list. She has history of very mild low k in October. Her ekg looked normal at that time.  Pt has history of low k and she thought this was reason for her symptoms. EMS wanted her to go to ED but she refused. Since then she has been eating some bananas  Pt states she has felt fine since Sunday.  Only slight frequent urination for 2 days.  SEE ROS  Pt here about 48 hours after incident. Feeling fine for 48 hours.  Pt ekg in October was good.  BS recently 140-150. Rare 200.  Past Medical History  Diagnosis Date  . Diabetes mellitus   . Hypertension   . Chest pressure, resolved 01/22/2012  . DM (diabetes mellitus) 01/22/2012  . Hypokalemia 01/22/2012  . Dysrhythmia   . Pacemaker     History   Social History  . Marital Status: Single    Spouse Name: N/A    Number of Children: N/A  . Years of Education: N/A   Occupational History  . Not on file.   Social History Main Topics  . Smoking status: Never Smoker   . Smokeless tobacco: Never Used  . Alcohol Use: No  . Drug Use: No  . Sexual Activity: Not Currently   Other Topics Concern  . Not on file   Social History Narrative    Past Surgical History  Procedure Laterality Date  . Pacemaker insertion    . Abdominal hysterectomy    . Hernia repair    . Insert / replace / remove pacemaker      Family History  Problem Relation Age of Onset  . Coronary artery disease Father   . Alzheimer's disease Mother    . Diabetes Sister   . Arthritis Sister   . Cancer Sister     breast cancer  . Breast cancer Mother     No Known Allergies  Current Outpatient Prescriptions on File Prior to Visit  Medication Sig Dispense Refill  . ACCU-CHEK FASTCLIX LANCETS MISC     . amlodipine-atorvastatin (CADUET) 10-20 MG per tablet TAKE 1 TABLET DAILY FOR BLOOD PRESSURE 90 tablet 1  . Insulin Glargine (LANTUS SOLOSTAR) 100 UNIT/ML Solostar Pen Inject 32 Units into the skin daily. 10 pen 0  . metFORMIN (GLUCOPHAGE) 500 MG tablet Take 1 tablet (500 mg total) by mouth 2 (two) times daily with a meal. 180 tablet 0  . nebivolol (BYSTOLIC) 10 MG tablet Take 1 tablet (10 mg total) by mouth at bedtime. 30 tablet 6  . ONETOUCH VERIO test strip Pt tests sugars twice daily.     No current facility-administered medications on file prior to visit.    BP 124/86 mmHg  Pulse 98  Temp(Src) 97.7 F (36.5 C) (Oral)  Ht 5\' 5"  (1.651 m)  Wt 251 lb (113.853 kg)  BMI 41.77 kg/m2  SpO2 96%  Review of Systems  Constitutional: Negative for fever, chills, diaphoresis, activity change and fatigue.       Only fatigue on Sunday but that passed quickly.  Respiratory: Negative for cough, chest tightness and shortness of breath.   Cardiovascular: Negative for chest pain, palpitations and leg swelling.  Gastrointestinal: Negative for nausea, vomiting, abdominal pain, diarrhea, constipation, blood in stool and abdominal distention.  Genitourinary: Positive for frequency. Negative for dysuria, urgency, hematuria, flank pain and pelvic pain.       Slight frequent urination x 2 days. But no other symptoms.  Musculoskeletal: Negative for back pain, neck pain and neck stiffness.  Neurological: Negative for dizziness, tremors, seizures, syncope, facial asymmetry, speech difficulty, weakness, light-headedness, numbness and headaches.  Hematological: Negative for adenopathy. Does not bruise/bleed easily.  Psychiatric/Behavioral: Negative  for behavioral problems, confusion and agitation. The patient is not nervous/anxious.        Objective:   Physical Exam   General Mental Status- Alert. General Appearance- Not in acute distress.   Skin General: Color- Normal Color. Moisture- Normal Moisture.  Neck Carotid Arteries- Normal color. Moisture- Normal Moisture. No carotid bruits. No JVD.  Chest and Lung Exam Auscultation: Breath Sounds:-Normal. CTA.  Cardiovascular Auscultation:Rythm- Regular, rate and rhythm. Murmurs & Other Heart Sounds:Auscultation of the heart reveals- No Murmurs.  Abdomen Inspection:-Inspeection Normal. Palpation/Percussion:Note:No mass. Palpation and Percussion of the abdomen reveal- Non Tender, Non Distended + BS, no rebound or guarding.  Back- no cva tenderness.    Neurologic Cranial Nerve exam:- CN III-XII intact(No nystagmus), symmetric smile. Drift Test:- No drift. Romberg Exam:- Negative.  Finger to Nose:- Normal/Intact Strength:- 5/5 equal and symmetric strength both upper and lower extremities.       Assessment & Plan:

## 2014-11-07 ENCOUNTER — Telehealth: Payer: Self-pay | Admitting: Family Medicine

## 2014-11-07 NOTE — Telephone Encounter (Signed)
Advised patient UC results are not back yet. Will call as soon as we receive.

## 2014-11-07 NOTE — Telephone Encounter (Signed)
Pt is inquiring about urine results

## 2014-11-08 ENCOUNTER — Other Ambulatory Visit: Payer: Self-pay | Admitting: General Practice

## 2014-11-08 MED ORDER — AMLODIPINE-ATORVASTATIN 10-20 MG PO TABS: ORAL_TABLET | ORAL | Status: DC

## 2014-11-18 ENCOUNTER — Telehealth: Payer: Self-pay | Admitting: Family Medicine

## 2014-11-18 NOTE — Telephone Encounter (Signed)
Caller name: Lisia Relation to pt:  self Call back number: [phone] Pharmacy:  Reason for call:   Patient requesting urine culture results. Saw Lauren Wilkins for this.

## 2014-11-20 ENCOUNTER — Encounter: Payer: Self-pay | Admitting: Family Medicine

## 2014-11-20 NOTE — Telephone Encounter (Signed)
Left message with lab who states they will have to call [facility] to get results. 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 Waiting for call from lab.

## 2014-11-21 NOTE — Telephone Encounter (Signed)
Results received from [facility]. Called patient informed she has low level bacteria and asked how she is feeling. Patient states she is feeling fine just has developed cold symptoms.

## 2014-11-25 ENCOUNTER — Other Ambulatory Visit: Payer: Self-pay | Admitting: General Practice

## 2014-11-25 DIAGNOSIS — I495 Sick sinus syndrome: Secondary | ICD-10-CM

## 2014-11-25 MED ORDER — NEBIVOLOL HCL 10 MG PO TABS: 10.0000 mg | ORAL_TABLET | Freq: Every day | ORAL | Status: DC

## 2014-11-28 ENCOUNTER — Ambulatory Visit: Payer: Private Health Insurance - Indemnity | Admitting: Family Medicine

## 2014-12-06 ENCOUNTER — Other Ambulatory Visit: Payer: Self-pay

## 2014-12-06 DIAGNOSIS — Z1231 Encounter for screening mammogram for malignant neoplasm of breast: Secondary | ICD-10-CM

## 2014-12-12 ENCOUNTER — Telehealth: Payer: Self-pay | Admitting: *Deleted

## 2014-12-12 ENCOUNTER — Ambulatory Visit: Payer: Private Health Insurance - Indemnity | Admitting: Family Medicine

## 2014-12-12 NOTE — Telephone Encounter (Signed)
See note below

## 2014-12-12 NOTE — Telephone Encounter (Signed)
Pt called and cancelled appointment, 12/12/14 at 10:30am, day of appointment.  Charge no show fee?

## 2014-12-12 NOTE — Telephone Encounter (Signed)
Yes- no show fee 

## 2014-12-16 ENCOUNTER — Ambulatory Visit: Payer: Private Health Insurance - Indemnity

## 2014-12-17 ENCOUNTER — Ambulatory Visit (INDEPENDENT_AMBULATORY_CARE_PROVIDER_SITE_OTHER): Payer: Managed Care, Other (non HMO) | Admitting: Family Medicine

## 2014-12-17 ENCOUNTER — Encounter: Payer: Self-pay | Admitting: Family Medicine

## 2014-12-17 VITALS — BP 118/78 | HR 76 | Temp 98.0°F | Resp 16 | Wt 251.5 lb

## 2014-12-17 DIAGNOSIS — Z86711 Personal history of pulmonary embolism: Secondary | ICD-10-CM

## 2014-12-17 DIAGNOSIS — E119 Type 2 diabetes mellitus without complications: Secondary | ICD-10-CM

## 2014-12-17 DIAGNOSIS — R8290 Unspecified abnormal findings in urine: Secondary | ICD-10-CM

## 2014-12-17 DIAGNOSIS — R5383 Other fatigue: Secondary | ICD-10-CM

## 2014-12-17 LAB — BASIC METABOLIC PANEL
BUN: 16 mg/dL (ref 6–23)
CO2: 25 meq/L (ref 19–32)
Calcium: 10.4 mg/dL (ref 8.4–10.5)
Chloride: 105 mEq/L (ref 96–112)
Creatinine, Ser: 0.79 mg/dL (ref 0.40–1.20)
GFR: 96.55 mL/min (ref >60.00)
Glucose, Bld: 125 mg/dL — ABNORMAL HIGH (ref 70–99)
Potassium: 3.5 mEq/L (ref 3.5–5.1)
SODIUM: 137 meq/L (ref 135–145)

## 2014-12-17 LAB — HEMOGLOBIN A1C: HEMOGLOBIN A1C: 9.1 % — AB (ref 4.6–6.5)

## 2014-12-17 LAB — HEPATIC FUNCTION PANEL
ALT: 41 U/L — AB (ref 0–35)
AST: 40 U/L — ABNORMAL HIGH (ref 0–37)
Albumin: 4 g/dL (ref 3.5–5.2)
Alkaline Phosphatase: 115 U/L (ref 39–117)
BILIRUBIN TOTAL: 0.4 mg/dL (ref 0.2–1.2)
Bilirubin, Direct: 0.1 mg/dL (ref 0.0–0.3)
Total Protein: 7.5 g/dL (ref 6.0–8.3)

## 2014-12-17 LAB — CBC WITH DIFFERENTIAL/PLATELET
BASOS ABS: 0 10*3/uL (ref 0.0–0.1)
Basophils Relative: 0.1 % (ref 0.0–3.0)
EOS ABS: 0.1 10*3/uL (ref 0.0–0.7)
Eosinophils Relative: 0.8 % (ref 0.0–5.0)
HCT: 42.3 % (ref 36.0–46.0)
HEMOGLOBIN: 14 g/dL (ref 12.0–15.0)
Lymphocytes Relative: 33.9 % (ref 12.0–46.0)
Lymphs Abs: 3 10*3/uL (ref 0.7–4.0)
MCHC: 33.2 g/dL (ref 30.0–36.0)
MCV: 81 fl (ref 78.0–100.0)
MONOS PCT: 5.7 % (ref 3.0–12.0)
Monocytes Absolute: 0.5 10*3/uL (ref 0.1–1.0)
Neutro Abs: 5.3 10*3/uL (ref 1.4–7.7)
Neutrophils Relative %: 59.5 % (ref 43.0–77.0)
Platelets: 302 10*3/uL (ref 150.0–400.0)
RBC: 5.23 Mil/uL — ABNORMAL HIGH (ref 3.87–5.11)
RDW: 16.2 % — ABNORMAL HIGH (ref 11.5–15.5)
WBC: 8.9 10*3/uL (ref 4.0–10.5)

## 2014-12-17 LAB — POCT URINALYSIS DIPSTICK
BILIRUBIN UA: NEGATIVE
Glucose, UA: NEGATIVE
Ketones, UA: NEGATIVE
Nitrite, UA: NEGATIVE
PROTEIN UA: NEGATIVE
Spec Grav, UA: 1.015
Urobilinogen, UA: 0.2
pH, UA: 6

## 2014-12-17 LAB — VITAMIN D 25 HYDROXY (VIT D DEFICIENCY, FRACTURES): VITD: 15.55 ng/mL — ABNORMAL LOW (ref 30.00–100.00)

## 2014-12-17 LAB — B12 AND FOLATE PANEL
Folate: 20.8 ng/mL (ref >5.9)
Vitamin B-12: 508 pg/mL (ref 211–911)

## 2014-12-17 LAB — TROPONIN I: TNIDX: 0 ug/l (ref 0.00–0.06)

## 2014-12-17 LAB — TSH: TSH: 4.17 u[IU]/mL (ref 0.35–4.50)

## 2014-12-17 NOTE — Patient Instructions (Signed)
Follow up as needed We'll notify you of your lab results and make any changes if needed We'll call you with your new Endo appt Drink plenty of fluids REST! Call with any questions or concerns Hang in there!

## 2014-12-17 NOTE — Assessment & Plan Note (Signed)
Check ddimer and troponin.

## 2014-12-17 NOTE — Progress Notes (Signed)
Pre visit review using our clinic review tool, if applicable. No additional management support is needed unless otherwise documented below in the visit note. 

## 2014-12-17 NOTE — Assessment & Plan Note (Signed)
Ongoing issue for pt.  May be multifactorial- recovering from illness, ongoing DM, possible UTI.  Due to hx of cardiac arrest and DVT/PE, will get D dimer and troponin.  Check labs to r/o infxn, anemia, thyroid abnormality.  Reviewed supportive care and red flags that should prompt return.  Pt expressed understanding and is in agreement w/ plan.

## 2014-12-17 NOTE — Assessment & Plan Note (Signed)
Chronic problem.  Pt is not due to see Endo until later this spring but will check A1C to see if uncontrolled DM is cause of her current fatigue.  Pt is also asking to switch Endo's due to distance to office.  Referral entered.

## 2014-12-17 NOTE — Assessment & Plan Note (Signed)
Pt has hx of similar.  Reports she is again having odor.  Get UA and culture to assess for infxn as cause of her vague symptoms and feeling of fatigue.

## 2014-12-17 NOTE — Progress Notes (Signed)
   Subjective:    Patient ID: Lauren LefortGenetha Wilkins, female    DOB: 02/15/1956, 59 y.o.   MRN: [id number]  HPI Fatigue- pt had URI 3 weeks ago and continues to have intermittent productive cough.  'no energy'.  + body aches.  'i just don't feel like myself'.  Pt has hx of similar sxs and had low Vit D at the time.  No fevers.  Pt considered going to the emergency room.  No vomiting, mild loose stools.  No medication changes.  No dietary changes.  Pt has hx of DM, following w/ Dr Sharl MaKerr but wants a more local provider.  No CP, SOB.  No dysuria but has an odor to it.  R leg pain but no redness, swelling, or TTP- 'it just aches'.   Review of Systems For ROS see HPI     Objective:   Physical Exam  Constitutional: She is oriented to person, place, and time. She appears well-developed and well-nourished. No distress.  HENT:  Head: Normocephalic and atraumatic.  Mouth/Throat: Oropharynx is clear and moist.  Neck: Normal range of motion. Neck supple. No thyromegaly present.  Cardiovascular: Normal rate, regular rhythm, normal heart sounds and intact distal pulses.   Pulmonary/Chest: Effort normal and breath sounds normal. No respiratory distress. She has no wheezes. She has no rales.  Abdominal: Soft. Bowel sounds are normal. She exhibits no distension. There is no tenderness. There is no rebound and no guarding.  Musculoskeletal: She exhibits no edema or tenderness.  Lymphadenopathy:    She has no cervical adenopathy.  Neurological: She is alert and oriented to person, place, and time.  Skin: Skin is warm and dry. No erythema.  Psychiatric: She has a normal mood and affect. Her behavior is normal. Thought content normal.  Vitals reviewed.         Assessment & Plan:

## 2014-12-18 ENCOUNTER — Other Ambulatory Visit: Payer: Self-pay | Admitting: Family Medicine

## 2014-12-18 LAB — URINE CULTURE
Colony Count: NO GROWTH
Organism ID, Bacteria: NO GROWTH

## 2014-12-18 NOTE — Telephone Encounter (Signed)
Med filled.  

## 2014-12-20 ENCOUNTER — Other Ambulatory Visit: Payer: Self-pay | Admitting: General Practice

## 2014-12-20 MED ORDER — VITAMIN D (ERGOCALCIFEROL) 1.25 MG (50000 UNIT) PO CAPS: 50000.0000 [IU] | ORAL_CAPSULE | ORAL | Status: DC

## 2015-01-10 ENCOUNTER — Encounter: Payer: Self-pay | Admitting: *Deleted

## 2015-01-16 ENCOUNTER — Encounter: Payer: Managed Care, Other (non HMO) | Admitting: Cardiovascular Disease

## 2015-01-20 ENCOUNTER — Ambulatory Visit
Admission: RE | Admit: 2015-01-20 | Discharge: 2015-01-20 | Disposition: A | Discharge status: Home / Self Care | Payer: Managed Care, Other (non HMO) | Source: Ambulatory Visit

## 2015-01-20 DIAGNOSIS — Z1231 Encounter for screening mammogram for malignant neoplasm of breast: Secondary | ICD-10-CM

## 2015-01-23 LAB — HM DIABETES FOOT EXAM: HM Diabetic Foot Exam: NORMAL

## 2015-01-23 LAB — HEMOGLOBIN A1C: Hgb A1c MFr Bld: 7.8 % — AB (ref 4.0–6.0)

## 2015-01-28 ENCOUNTER — Encounter: Payer: Self-pay | Admitting: General Practice

## 2015-02-10 ENCOUNTER — Other Ambulatory Visit: Payer: Self-pay | Admitting: General Practice

## 2015-02-10 MED ORDER — VALSARTAN-HYDROCHLOROTHIAZIDE 160-25 MG PO TABS: 1.0000 | ORAL_TABLET | Freq: Every day | ORAL | Status: DC

## 2015-03-12 ENCOUNTER — Other Ambulatory Visit: Payer: Self-pay | Admitting: General Practice

## 2015-03-12 DIAGNOSIS — I495 Sick sinus syndrome: Secondary | ICD-10-CM

## 2015-03-12 MED ORDER — NEBIVOLOL HCL 10 MG PO TABS: 10.0000 mg | ORAL_TABLET | Freq: Every day | ORAL | Status: DC

## 2015-03-31 ENCOUNTER — Emergency Department (HOSPITAL_COMMUNITY)
Admission: EM | Admit: 2015-03-31 | Discharge: 2015-03-31 | Disposition: A | Discharge status: Home / Self Care | Payer: Managed Care, Other (non HMO) | Attending: Emergency Medicine | Admitting: Emergency Medicine

## 2015-03-31 ENCOUNTER — Encounter (HOSPITAL_COMMUNITY): Payer: Self-pay | Admitting: Emergency Medicine

## 2015-03-31 ENCOUNTER — Telehealth: Payer: Self-pay | Admitting: Cardiovascular Disease

## 2015-03-31 DIAGNOSIS — Z79899 Other long term (current) drug therapy: Secondary | ICD-10-CM | POA: Insufficient documentation

## 2015-03-31 DIAGNOSIS — I10 Essential (primary) hypertension: Secondary | ICD-10-CM | POA: Insufficient documentation

## 2015-03-31 DIAGNOSIS — E119 Type 2 diabetes mellitus without complications: Secondary | ICD-10-CM | POA: Insufficient documentation

## 2015-03-31 DIAGNOSIS — R55 Syncope and collapse: Secondary | ICD-10-CM | POA: Diagnosis not present

## 2015-03-31 DIAGNOSIS — R002 Palpitations: Secondary | ICD-10-CM | POA: Insufficient documentation

## 2015-03-31 DIAGNOSIS — Z794 Long term (current) use of insulin: Secondary | ICD-10-CM | POA: Insufficient documentation

## 2015-03-31 DIAGNOSIS — Z950 Presence of cardiac pacemaker: Secondary | ICD-10-CM | POA: Diagnosis not present

## 2015-03-31 DIAGNOSIS — Z86711 Personal history of pulmonary embolism: Secondary | ICD-10-CM | POA: Diagnosis not present

## 2015-03-31 DIAGNOSIS — E785 Hyperlipidemia, unspecified: Secondary | ICD-10-CM | POA: Insufficient documentation

## 2015-03-31 DIAGNOSIS — N3000 Acute cystitis without hematuria: Secondary | ICD-10-CM | POA: Diagnosis not present

## 2015-03-31 DIAGNOSIS — R42 Dizziness and giddiness: Secondary | ICD-10-CM | POA: Diagnosis present

## 2015-03-31 LAB — URINE MICROSCOPIC-ADD ON

## 2015-03-31 LAB — CBC
HCT: 41.3 % (ref 36.0–46.0)
Hemoglobin: 13.6 g/dL (ref 12.0–15.0)
MCH: 27.3 pg (ref 26.0–34.0)
MCHC: 32.9 g/dL (ref 30.0–36.0)
MCV: 82.8 fL (ref 78.0–100.0)
Platelets: 292 10*3/uL (ref 150–400)
RBC: 4.99 MIL/uL (ref 3.87–5.11)
RDW: 14.9 % (ref 11.5–15.5)
WBC: 7 10*3/uL (ref 4.0–10.5)

## 2015-03-31 LAB — BASIC METABOLIC PANEL
ANION GAP: 11 (ref 5–15)
BUN: 10 mg/dL (ref 6–20)
CO2: 24 mmol/L (ref 22–32)
Calcium: 9.5 mg/dL (ref 8.9–10.3)
Chloride: 105 mmol/L (ref 101–111)
Creatinine, Ser: 0.86 mg/dL (ref 0.44–1.00)
GFR calc non Af Amer: >60 mL/min (ref >60)
Glucose, Bld: 119 mg/dL — ABNORMAL HIGH (ref 70–99)
POTASSIUM: 2.9 mmol/L — AB (ref 3.5–5.1)
Sodium: 140 mmol/L (ref 135–145)

## 2015-03-31 LAB — URINALYSIS, ROUTINE W REFLEX MICROSCOPIC
BILIRUBIN URINE: NEGATIVE
GLUCOSE, UA: NEGATIVE mg/dL
Hgb urine dipstick: NEGATIVE
Ketones, ur: NEGATIVE mg/dL
Nitrite: NEGATIVE
PH: 7 (ref 5.0–8.0)
PROTEIN: NEGATIVE mg/dL
Specific Gravity, Urine: 1.01 (ref 1.005–1.030)
Urobilinogen, UA: 0.2 mg/dL (ref 0.0–1.0)

## 2015-03-31 LAB — I-STAT TROPONIN, ED: TROPONIN I, POC: 0.01 ng/mL (ref 0.00–0.08)

## 2015-03-31 LAB — TSH: TSH: 2.659 u[IU]/mL (ref 0.350–4.500)

## 2015-03-31 LAB — MAGNESIUM: Magnesium: 1.7 mg/dL (ref 1.7–2.4)

## 2015-03-31 MED ORDER — POTASSIUM CHLORIDE CRYS ER 20 MEQ PO TBCR
40.0000 meq | EXTENDED_RELEASE_TABLET | Freq: Once | ORAL | Status: AC
  Administered 2015-03-31: 40 meq via ORAL
  Filled 2015-03-31: qty 2

## 2015-03-31 MED ORDER — NITROFURANTOIN MONOHYD MACRO 100 MG PO CAPS: 100.0000 mg | ORAL_CAPSULE | Freq: Two times a day (BID) | ORAL | Status: DC

## 2015-03-31 MED ORDER — NITROFURANTOIN MONOHYD MACRO 100 MG PO CAPS
100.0000 mg | ORAL_CAPSULE | Freq: Once | ORAL | Status: AC
  Administered 2015-03-31: 100 mg via ORAL
  Filled 2015-03-31: qty 1

## 2015-03-31 NOTE — Telephone Encounter (Signed)
Closed Encounter  °

## 2015-03-31 NOTE — ED Notes (Signed)
Pt reports at approx 645 this morning felt very dizzy and heart racing sensation. States she layed down in the floor at home and symptoms improved. Pt awake, alert, oriented x4, NAd at present.

## 2015-03-31 NOTE — Discharge Instructions (Signed)
Palpitations A palpitation is the feeling that your heartbeat is irregular or is faster than normal. It may feel like your heart is fluttering or skipping a beat. Palpitations are usually not a serious problem. However, in some cases, you may need further medical evaluation. CAUSES  Palpitations can be caused by:  Smoking.  Caffeine or other stimulants, such as diet pills or energy drinks.  Alcohol.  Stress and anxiety.  Strenuous physical activity.  Fatigue.  Certain medicines.  Heart disease, especially if you have a history of irregular heart rhythms (arrhythmias), such as atrial fibrillation, atrial flutter, or supraventricular tachycardia.  An improperly working pacemaker or defibrillator. DIAGNOSIS  To find the cause of your palpitations, your health care provider will take your medical history and perform a physical exam. Your health care provider may also have you take a test called an ambulatory electrocardiogram (ECG). An ECG records your heartbeat patterns over a 24-hour period. You may also have other tests, such as:  Transthoracic echocardiogram (TTE). During echocardiography, sound waves are used to evaluate how blood flows through your heart.  Transesophageal echocardiogram (TEE).  Cardiac monitoring. This allows your health care provider to monitor your heart rate and rhythm in real time.  Holter monitor. This is a portable device that records your heartbeat and can help diagnose heart arrhythmias. It allows your health care provider to track your heart activity for several days, if needed.  Stress tests by exercise or by giving medicine that makes the heart beat faster. TREATMENT  Treatment of palpitations depends on the cause of your symptoms and can vary greatly. Most cases of palpitations do not require any treatment other than time, relaxation, and monitoring your symptoms. Other causes, such as atrial fibrillation, atrial flutter, or supraventricular  tachycardia, usually require further treatment. HOME CARE INSTRUCTIONS   Avoid:  Caffeinated coffee, tea, soft drinks, diet pills, and energy drinks.  Chocolate.  Alcohol.  Stop smoking if you smoke.  Reduce your stress and anxiety. Things that can help you relax include:  A method of controlling things in your body, such as your heartbeats, with your mind (biofeedback).  Yoga.  Meditation.  Physical activity such as swimming, jogging, or walking.  Get plenty of rest and sleep. SEEK MEDICAL CARE IF:   You continue to have a fast or irregular heartbeat beyond 24 hours.  Your palpitations occur more often. SEEK IMMEDIATE MEDICAL CARE IF:  You have chest pain or shortness of breath.  You have a severe headache.  You feel dizzy or you faint. MAKE SURE YOU:  Understand these instructions.  Will watch your condition.  Will get help right away if you are not doing well or get worse. Document Released: 11/05/2000 Document Revised: 11/13/2013 Document Reviewed: 01/07/2012 [facility] Patient Information 2015 [place], [place]. This information is not intended to replace advice given to you by your health care provider. Make sure you discuss any questions you have with your health care provider.  Urinary Tract Infection Urinary tract infections (UTIs) can develop anywhere along your urinary tract. Your urinary tract is your body's drainage system for removing wastes and extra water. Your urinary tract includes two kidneys, two ureters, a bladder, and a urethra. Your kidneys are a pair of bean-shaped organs. Each kidney is about the size of your fist. They are located below your ribs, one on each side of your spine. CAUSES Infections are caused by microbes, which are microscopic organisms, including fungi, viruses, and bacteria. These organisms are so small that they  can only be seen through a microscope. Bacteria are the microbes that most commonly cause UTIs. SYMPTOMS  Symptoms  of UTIs may vary by age and gender of the patient and by the location of the infection. Symptoms in young women typically include a frequent and intense urge to urinate and a painful, burning feeling in the bladder or urethra during urination. Older women and men are more likely to be tired, shaky, and weak and have muscle aches and abdominal pain. A fever may mean the infection is in your kidneys. Other symptoms of a kidney infection include pain in your back or sides below the ribs, nausea, and vomiting. DIAGNOSIS To diagnose a UTI, your caregiver will ask you about your symptoms. Your caregiver also will ask to provide a urine sample. The urine sample will be tested for bacteria and white blood cells. White blood cells are made by your body to help fight infection. TREATMENT  Typically, UTIs can be treated with medication. Because most UTIs are caused by a bacterial infection, they usually can be treated with the use of antibiotics. The choice of antibiotic and length of treatment depend on your symptoms and the type of bacteria causing your infection. HOME CARE INSTRUCTIONS  If you were prescribed antibiotics, take them exactly as your caregiver instructs you. Finish the medication even if you feel better after you have only taken some of the medication.  Drink enough water and fluids to keep your urine clear or pale yellow.  Avoid caffeine, tea, and carbonated beverages. They tend to irritate your bladder.  Empty your bladder often. Avoid holding urine for long periods of time.  Empty your bladder before and after sexual intercourse.  After a bowel movement, women should cleanse from front to back. Use each tissue only once. SEEK MEDICAL CARE IF:   You have back pain.  You develop a fever.  Your symptoms do not begin to resolve within 3 days. SEEK IMMEDIATE MEDICAL CARE IF:   You have severe back pain or lower abdominal pain.  You develop chills.  You have nausea or  vomiting.  You have continued burning or discomfort with urination. MAKE SURE YOU:   Understand these instructions.  Will watch your condition.  Will get help right away if you are not doing well or get worse. Document Released: 08/18/2005 Document Revised: 05/09/2012 Document Reviewed: 12/17/2011 [facility] Patient Information 2015 [place], [place]. This information is not intended to replace advice given to you by your health care provider. Make sure you discuss any questions you have with your health care provider.  Palpitations A palpitation is the feeling that your heartbeat is irregular or is faster than normal. It may feel like your heart is fluttering or skipping a beat. Palpitations are usually not a serious problem. However, in some cases, you may need further medical evaluation. CAUSES  Palpitations can be caused by:  Smoking.  Caffeine or other stimulants, such as diet pills or energy drinks.  Alcohol.  Stress and anxiety.  Strenuous physical activity.  Fatigue.  Certain medicines.  Heart disease, especially if you have a history of irregular heart rhythms (arrhythmias), such as atrial fibrillation, atrial flutter, or supraventricular tachycardia.  An improperly working pacemaker or defibrillator. DIAGNOSIS  To find the cause of your palpitations, your health care provider will take your medical history and perform a physical exam. Your health care provider may also have you take a test called an ambulatory electrocardiogram (ECG). An ECG records your heartbeat patterns over a  24-hour period. You may also have other tests, such as:  Transthoracic echocardiogram (TTE). During echocardiography, sound waves are used to evaluate how blood flows through your heart.  Transesophageal echocardiogram (TEE).  Cardiac monitoring. This allows your health care provider to monitor your heart rate and rhythm in real time.  Holter monitor. This is a portable device that records  your heartbeat and can help diagnose heart arrhythmias. It allows your health care provider to track your heart activity for several days, if needed.  Stress tests by exercise or by giving medicine that makes the heart beat faster. TREATMENT  Treatment of palpitations depends on the cause of your symptoms and can vary greatly. Most cases of palpitations do not require any treatment other than time, relaxation, and monitoring your symptoms. Other causes, such as atrial fibrillation, atrial flutter, or supraventricular tachycardia, usually require further treatment. HOME CARE INSTRUCTIONS   Avoid:  Caffeinated coffee, tea, soft drinks, diet pills, and energy drinks.  Chocolate.  Alcohol.  Stop smoking if you smoke.  Reduce your stress and anxiety. Things that can help you relax include:  A method of controlling things in your body, such as your heartbeats, with your mind (biofeedback).  Yoga.  Meditation.  Physical activity such as swimming, jogging, or walking.  Get plenty of rest and sleep. SEEK MEDICAL CARE IF:   You continue to have a fast or irregular heartbeat beyond 24 hours.  Your palpitations occur more often. SEEK IMMEDIATE MEDICAL CARE IF:  You have chest pain or shortness of breath.  You have a severe headache.  You feel dizzy or you faint. MAKE SURE YOU:  Understand these instructions.  Will watch your condition.  Will get help right away if you are not doing well or get worse. Document Released: 11/05/2000 Document Revised: 11/13/2013 Document Reviewed: 01/07/2012 [facility] Patient Information 2015 [place], [place]. This information is not intended to replace advice given to you by your health care provider. Make sure you discuss any questions you have with your health care provider.  Urinary Tract Infection Urinary tract infections (UTIs) can develop anywhere along your urinary tract. Your urinary tract is your body's drainage system for removing wastes  and extra water. Your urinary tract includes two kidneys, two ureters, a bladder, and a urethra. Your kidneys are a pair of bean-shaped organs. Each kidney is about the size of your fist. They are located below your ribs, one on each side of your spine. CAUSES Infections are caused by microbes, which are microscopic organisms, including fungi, viruses, and bacteria. These organisms are so small that they can only be seen through a microscope. Bacteria are the microbes that most commonly cause UTIs. SYMPTOMS  Symptoms of UTIs may vary by age and gender of the patient and by the location of the infection. Symptoms in young women typically include a frequent and intense urge to urinate and a painful, burning feeling in the bladder or urethra during urination. Older women and men are more likely to be tired, shaky, and weak and have muscle aches and abdominal pain. A fever may mean the infection is in your kidneys. Other symptoms of a kidney infection include pain in your back or sides below the ribs, nausea, and vomiting. DIAGNOSIS To diagnose a UTI, your caregiver will ask you about your symptoms. Your caregiver also will ask to provide a urine sample. The urine sample will be tested for bacteria and white blood cells. White blood cells are made by your body to help fight  infection. TREATMENT  Typically, UTIs can be treated with medication. Because most UTIs are caused by a bacterial infection, they usually can be treated with the use of antibiotics. The choice of antibiotic and length of treatment depend on your symptoms and the type of bacteria causing your infection. HOME CARE INSTRUCTIONS  If you were prescribed antibiotics, take them exactly as your caregiver instructs you. Finish the medication even if you feel better after you have only taken some of the medication.  Drink enough water and fluids to keep your urine clear or pale yellow.  Avoid caffeine, tea, and carbonated beverages. They tend to  irritate your bladder.  Empty your bladder often. Avoid holding urine for long periods of time.  Empty your bladder before and after sexual intercourse.  After a bowel movement, women should cleanse from front to back. Use each tissue only once. SEEK MEDICAL CARE IF:   You have back pain.  You develop a fever.  Your symptoms do not begin to resolve within 3 days. SEEK IMMEDIATE MEDICAL CARE IF:   You have severe back pain or lower abdominal pain.  You develop chills.  You have nausea or vomiting.  You have continued burning or discomfort with urination. MAKE SURE YOU:   Understand these instructions.  Will watch your condition.  Will get help right away if you are not doing well or get worse. Document Released: 08/18/2005 Document Revised: 05/09/2012 Document Reviewed: 12/17/2011 [facility] Patient Information 2015 [place], [place]. This information is not intended to replace advice given to you by your health care provider. Make sure you discuss any questions you have with your health care provider.

## 2015-03-31 NOTE — ED Provider Notes (Signed)
CSN: [id number]     Arrival date & time 03/31/15  0710 History   First MD Initiated Contact with Patient 03/31/15 [phone]     Chief Complaint  Patient presents with  . Dizziness  . Palpitations     (Consider location/radiation/quality/duration/timing/severity/associated sxs/prior Treatment) Patient is a 59 y.o. female presenting with dizziness and palpitations. The history is provided by the patient.  Dizziness Quality:  Lightheadedness Severity:  Severe Onset quality:  Sudden Duration: 5 minutes. Timing:  Sporadic Progression:  Unchanged Chronicity:  Recurrent Context comment:  Spontaneous Relieved by: lying in the floor and putting her legs up. Worsened by:  Nothing Associated symptoms: palpitations and syncope (not true syncope. but near-syncope)   Associated symptoms: no chest pain, no headaches, no shortness of breath and no vomiting   Palpitations Associated symptoms: dizziness and syncope (not true syncope. but near-syncope)   Associated symptoms: no chest pain, no cough, no shortness of breath and no vomiting     Past Medical History  Diagnosis Date  . Diabetes mellitus   . Hypertension   . Chest pressure, resolved 01/22/2012  . DM (diabetes mellitus) 01/22/2012  . Hypokalemia 01/22/2012  . Dysrhythmia   . 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 History of pulmonary embolism   . Hyperlipidemia    Past Surgical History  Procedure Laterality Date  . Pacemaker insertion  08/2004    [employer] [place] XL[employer]  . Abdominal hysterectomy  07/2002  . Hernia repair    . Insert / replace / remove pacemaker     Family History  Problem Relation Age of Onset  . Coronary artery disease Father   . Heart attack Father   . Diabetes Sister   . Arthritis Sister   . Breast cancer Sister   . Breast cancer Mother   . Alzheimer's disease Mother   . Hypertension Mother    History  Substance Use Topics  . Smoking status: Never Smoker   . Smokeless tobacco:  Never Used  . Alcohol Use: No   OB History    No data available     Review of Systems  Constitutional: Negative for fever.  Respiratory: Negative for cough and shortness of breath.   Cardiovascular: Positive for palpitations and syncope (not true syncope. but near-syncope). Negative for chest pain.  Gastrointestinal: Negative for vomiting.  Neurological: Positive for dizziness and light-headedness. Negative for headaches.  All other systems reviewed and are negative.     Allergies  Review of patient's allergies indicates no known allergies.  Home Medications   Prior to Admission medications   Medication Sig Start Date End Date Taking? Authorizing Provider  ACCU-CHEK FASTCLIX LANCETS MISC  01/22/14   Historical Provider, MD  amlodipine-atorvastatin (CADUET) 10-20 MG per tablet TAKE 1 TABLET DAILY FOR BLOOD PRESSURE 11/08/14   Sheliah HatchKatherine E Tabori, MD  BD PEN NEEDLE NANO U/F 32G X 4 MM MISC  09/15/14   Historical Provider, MD  Insulin Glargine (LANTUS SOLOSTAR) 100 UNIT/ML Solostar Pen Inject 32 Units into the skin daily. 09/11/14   Sheliah HatchKatherine E Tabori, MD  metFORMIN (GLUCOPHAGE) 500 MG tablet TAKE 1 TABLET BY MOUTH TWICE A DAY WITH MEALS 12/18/14   Sheliah HatchKatherine E Tabori, MD  nebivolol (BYSTOLIC) 10 MG tablet Take 1 tablet (10 mg total) by mouth at bedtime. 03/12/15   Sheliah HatchKatherine E Tabori, MD  [facility] test strip Pt tests sugars twice daily. 08/01/13   Historical Provider, MD  valsartan-hydrochlorothiazide (DIOVAN-HCT) 160-25 MG  per tablet Take 1 tablet by mouth daily. 02/10/15   Sheliah HatchKatherine E Tabori, MD  Vitamin D, Ergocalciferol, (DRISDOL) 50000 UNITS CAPS capsule Take 1 capsule (50,000 Units total) by mouth every 7 (seven) days. 12/20/14   Sheliah HatchKatherine E Tabori, MD   BP 145/92 mmHg  Pulse 88  Temp(Src) 98.7 F (37.1 C) (Oral)  Resp 19  Ht 5\' 6"  (1.676 m)  Wt 250 lb (113.399 kg)  BMI 40.37 kg/m2  SpO2 99% Physical Exam  Constitutional: She is oriented to person, place, and time. She  appears well-developed and well-nourished. No distress.  HENT:  Head: Normocephalic and atraumatic.  Mouth/Throat: Oropharynx is clear and moist.  Eyes: EOM are normal. Pupils are equal, round, and reactive to light.  Neck: Normal range of motion. Neck supple.  Cardiovascular: Normal rate and regular rhythm.  Exam reveals no friction rub.   No murmur heard. Pulmonary/Chest: Effort normal and breath sounds normal. No respiratory distress. She has no wheezes. She has no rales.  Abdominal: Soft. She exhibits no distension. There is no tenderness. There is no rebound.  Musculoskeletal: Normal range of motion. She exhibits no edema.  Neurological: She is alert and oriented to person, place, and time.  Skin: No rash noted. She is not diaphoretic.  Nursing note and vitals reviewed.   ED Course  Procedures (including critical care time) Labs Review Labs Reviewed - No data to display  Imaging Review No results found.   EKG Interpretation   Date/Time:  Monday Mar 31 2015 07:17:38 EDT Ventricular Rate:  91 PR Interval:  165 QRS Duration: 84 QT Interval:  355 QTC Calculation: 437 R Axis:   -17 Text Interpretation:  Sinus rhythm Low voltage, precordial leads Left  ventricular hypertrophy Borderline T abnormalities, anterior leads No  significant change sinec last tracing Confirmed by Gwendolyn GrantWALDEN  MD, Monita Swier  ([id number]) on 03/31/2015 7:29:47 AM      MDM   Final diagnoses:  Acute cystitis without hematuria  Palpitations    59 year old female here with dizziness. Has had 3 episodes in the past several months, 13 months ago, 14 days ago, and one this morning. Spontaneous episode of dizziness. Last about 5 minutes. Resolved with lying down and putting her feet up. Has associated near syncope but no true syncope. No chest pain, shortness of breath, nausea, vomiting. Described as lightheadedness type of dizziness. She has had a pacemaker for prior low heart rates that was placed 10 years ago. She  does not have any coronary history, has multiple risk factors including obesity, diabetes, hypertension, hyperlipidemia.  Exam normal here, EKG normal. Will plan on labs and Cards consult. Pacemaker interrogation revealed no episodes of tachycardia. She is mildly hypokalemic and replaced orally here. Patient also reported mild dysuria and results of her UA shows a UTI. I spoke with Dr. Myrtis SerKatz of cardiology who will arrange follow-up for Holter monitoring and with her primary cardiologist. Given Macrobid. Stable for discharge.    Elwin MochaBlair Kross Swallows, MD 03/31/15 1006

## 2015-04-01 LAB — URINE CULTURE: Special Requests: NORMAL

## 2015-04-08 ENCOUNTER — Ambulatory Visit (INDEPENDENT_AMBULATORY_CARE_PROVIDER_SITE_OTHER): Payer: Managed Care, Other (non HMO) | Admitting: Cardiovascular Disease

## 2015-04-08 ENCOUNTER — Encounter: Payer: Self-pay | Admitting: Cardiovascular Disease

## 2015-04-08 VITALS — BP 134/86 | HR 72 | Ht 66.0 in | Wt 255.5 lb

## 2015-04-08 DIAGNOSIS — I10 Essential (primary) hypertension: Secondary | ICD-10-CM | POA: Diagnosis not present

## 2015-04-08 DIAGNOSIS — I455 Other specified heart block: Secondary | ICD-10-CM | POA: Diagnosis not present

## 2015-04-08 DIAGNOSIS — I495 Sick sinus syndrome: Secondary | ICD-10-CM

## 2015-04-08 DIAGNOSIS — Z86711 Personal history of pulmonary embolism: Secondary | ICD-10-CM

## 2015-04-08 DIAGNOSIS — Z950 Presence of cardiac pacemaker: Secondary | ICD-10-CM

## 2015-04-08 MED ORDER — NEBIVOLOL HCL 10 MG PO TABS: 10.0000 mg | ORAL_TABLET | Freq: Every day | ORAL | Status: DC

## 2015-04-08 MED ORDER — VALSARTAN-HYDROCHLOROTHIAZIDE 320-12.5 MG PO TABS: 1.0000 | ORAL_TABLET | Freq: Every day | ORAL | Status: DC

## 2015-04-08 NOTE — Progress Notes (Signed)
Patient ID: Lauren Wilkins, female   DOB: 1956/04/01, 59 y.o.   MRN: [id number]     Cardiology Office Note   Date:  04/08/2015   ID:  Lauren Wilkins, DOB 1956/06/11, MRN [id number]  PCP:  Neena Rhymes, MD  Cardiologist:   Thurmon Fair, MD   Chief Complaint  Patient presents with  . Hospitalization Follow-up    for rapid heart rate and dizziness      History of Present Illness: Lauren Wilkins is a 59 y.o. female who presents for follow-up after an emergency room visit for palpitations and dizziness. She felt faint but did not have syncope. The episodes would resolve if she would lie down and put her feet up. Pacemaker interrogation showed no evidence of tachyarrhythmia and showed normal device function. As before she has a less than 1% pacing in either the atrium or the ventricle. Estimated generator longevity is 2. 5-7 years. Workup was significant for severe hypokalemia with a potassium of 2.9 and questionable abnormalities on the urinalysis. TSH was normal as was her CBC. Point of care troponin was normal.  She has systemic hypertension for which she takes a combination ARB-hydrochlorothiazide (the latter in the higher dose) and has diabetes mellitus with a hemoglobin A1c that has been in excess of 7%. More than 10 years ago she had an episode of pulmonary embolism. She has not been on anticoagulants for many years.  Past Medical History  Diagnosis Date  . Diabetes mellitus   . Hypertension   . Chest pressure, resolved 01/22/2012  . DM (diabetes mellitus) 01/22/2012  . Hypokalemia 01/22/2012  . Dysrhythmia   . 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 History of pulmonary embolism   . Hyperlipidemia     Past Surgical History  Procedure Laterality Date  . Pacemaker insertion  08/2004    [facility] [place]  . Abdominal hysterectomy  07/2002  . Hernia repair    . Insert / replace / remove pacemaker       Current Outpatient Prescriptions    Medication Sig Dispense Refill  . amlodipine-atorvastatin (CADUET) 10-20 MG per tablet TAKE 1 TABLET DAILY FOR BLOOD PRESSURE 90 tablet 1  . BD PEN NEEDLE NANO U/F 32G X 4 MM MISC   0  . Insulin Glargine (LANTUS SOLOSTAR) 100 UNIT/ML Solostar Pen Inject 32 Units into the skin daily. 10 pen 0  . metFORMIN (GLUCOPHAGE) 500 MG tablet TAKE 1 TABLET BY MOUTH TWICE A DAY WITH MEALS 180 tablet 0  . nebivolol (BYSTOLIC) 10 MG tablet Take 1 tablet (10 mg total) by mouth at bedtime. 14 tablet 0  . ONETOUCH VERIO test strip Pt tests sugars twice daily.    Marland Kitchen TANZEUM 30 MG PEN Inject 30 mg into the skin every 7 (seven) days. Take on Sunday    . valsartan-hydrochlorothiazide (DIOVAN-HCT) 320-12.5 MG per tablet Take 1 tablet by mouth daily. 90 tablet 3   No current facility-administered medications for this visit.    Allergies:   Review of patient's allergies indicates no known allergies.    Social History:  The patient  reports that she has never smoked. She has never used smokeless tobacco. She reports that she does not drink alcohol or use illicit drugs.   Family History:  The patient's family history includes Alzheimer's disease in her mother; Arthritis in her sister; Breast cancer in her mother and sister; Coronary artery disease in her father; Diabetes in her sister; Heart attack in her  father; Hypertension in her mother.    ROS:  Please see the history of present illness.    Otherwise, review of systems positive for none.   All other systems are reviewed and negative.    PHYSICAL EXAM: VS:  BP 134/86 mmHg  Pulse 72  Ht 5\' 6"  (1.676 m)  Wt 255 lb 8 oz (115.894 kg)  BMI 41.26 kg/m2 , BMI Body mass index is 41.26 kg/(m^2).  General: Alert, oriented x3, no distress Head: no evidence of trauma, PERRL, EOMI, no exophtalmos or lid lag, no myxedema, no xanthelasma; normal ears, nose and oropharynx Neck: normal jugular venous pulsations and no hepatojugular reflux; brisk carotid pulses without  delay and no carotid bruits Chest: clear to auscultation, no signs of consolidation by percussion or palpation, normal fremitus, symmetrical and full respiratory excursions Cardiovascular: normal position and quality of the apical impulse, regular rhythm, normal first and second heart sounds, no murmurs, rubs or gallops Abdomen: no tenderness or distention, no masses by palpation, no abnormal pulsatility or arterial bruits, normal bowel sounds, no hepatosplenomegaly Extremities: no clubbing, cyanosis or edema; 2+ radial, ulnar and brachial pulses bilaterally; 2+ right femoral, posterior tibial and dorsalis pedis pulses; 2+ left femoral, posterior tibial and dorsalis pedis pulses; no subclavian or femoral bruits Neurological: grossly nonfocal Psych: euthymic mood, full affect   EKG:  EKG is not ordered today. The ekg ordered 04/01/15 demonstrates sinus rhythm and voltage criteria for left ventricular hypertrophy   Recent Labs: 12/17/2014: ALT 41* 03/31/2015: BUN 10; Creatinine 0.86; Hemoglobin 13.6; Magnesium 1.7; Platelets 292; Potassium 2.9*; Sodium 140; TSH 2.659    Lipid Panel    Component Value Date/Time   CHOL 191 04/22/2014 0833   TRIG 180.0* 04/22/2014 0833   HDL 54.60 04/22/2014 0833   CHOLHDL 3 04/22/2014 0833   VLDL 36.0 04/22/2014 0833   LDLCALC 100* 04/22/2014 0833      Wt Readings from Last 3 Encounters:  04/08/15 255 lb 8 oz (115.894 kg)  03/31/15 250 lb (113.399 kg)  12/17/14 251 lb 8 oz (114.08 kg)     ASSESSMENT AND PLAN:  1. Near syncope - she has virtually no need for pacing and I wonder whether her episode of "sinus arrest" that led to implantation of her pacemaker was due to a neurally mediated event. If that is the case she may still be having episodes of near syncope related to vasodepressor phenomenon. This would explain why her symptoms improve in the supine position The pacemaker seems to be preventing any syncope and I don't think additional intervention  is necessary. Advised her to stay well hydrated. She has hypertension and salt loading is probably not appropriate. Have decreased dose of diuretic.  2. Palpitations - these were very likely related to her hypokalemia. To avoid similar events in the future will alter her blood pressure medication to increase the dose of ARB and decrease the dose of hydrochlorothiazide  3. Hypertension - well controlled; see above  4. Diabetes mellitus/obesity - poor glycemic control may be contributing to hypokalemia  5. Remote history of pulmonary embolism  6. Normal dual-chamber permanent pacemaker function. Her device is not capable of remote monitoring. Will bring her back to device clinic in 6 months and an office visit yearly.    Current medicines are reviewed at length with the patient today.  The patient does not have concerns regarding medicines.  The following changes have been made:  Change DIOVAN HCT to the 320-12.5 dose  Labs/ tests ordered today  include:  No orders of the defined types were placed in this encounter.    Patient Instructions  INCREASE Valsartan HCTZ to 320/12.5mg  daily.  A new Rx has been sent to your pharmacy.  Your physician recommends that you schedule a follow-up appointment in: 6 [facility] [address]  Dr. Royann Shiversroitoru recommends that you schedule a follow-up appointment in: ONE YEAR.       Joie BimlerSigned, Tarren Velardi, MD  04/08/2015 2:44 PM    Thurmon FairMihai Laryn Venning, MD, [facility] [phone] office [phone] pager

## 2015-04-08 NOTE — Patient Instructions (Signed)
INCREASE Valsartan HCTZ to 320/12.5mg  daily.  A new Rx has been sent to your pharmacy.  Your physician recommends that you schedule a follow-up appointment in: 6 months at [facility] device clinic [facility] [address].  Dr. Royann Shiversroitoru recommends that you schedule a follow-up appointment in: ONE YEAR.

## 2015-04-09 ENCOUNTER — Ambulatory Visit: Payer: Managed Care, Other (non HMO) | Admitting: Family Medicine

## 2015-04-16 ENCOUNTER — Encounter: Payer: Self-pay | Admitting: Family Medicine

## 2015-04-16 ENCOUNTER — Ambulatory Visit (INDEPENDENT_AMBULATORY_CARE_PROVIDER_SITE_OTHER): Payer: Managed Care, Other (non HMO) | Admitting: Family Medicine

## 2015-04-16 VITALS — BP 134/80 | HR 79 | Temp 98.3°F | Resp 16 | Wt 254.4 lb

## 2015-04-16 DIAGNOSIS — E876 Hypokalemia: Secondary | ICD-10-CM | POA: Diagnosis not present

## 2015-04-16 DIAGNOSIS — E559 Vitamin D deficiency, unspecified: Secondary | ICD-10-CM | POA: Diagnosis not present

## 2015-04-16 NOTE — Assessment & Plan Note (Signed)
Pt completed 12 week course of 50,000 units weekly and continues to take daily OTC supplement.  Due for repeat labs to see if additional tx is needed.  Will replete prn.

## 2015-04-16 NOTE — Progress Notes (Signed)
Pre visit review using our clinic review tool, if applicable. No additional management support is needed unless otherwise documented below in the visit note. 

## 2015-04-16 NOTE — Assessment & Plan Note (Signed)
Recurring problem for pt.  Not currently on K+ supplement.  Asymptomatic.  Check labs and replete prn.

## 2015-04-16 NOTE — Progress Notes (Signed)
   Subjective:    Patient ID: Lauren Wilkins, female    DOB: 08-12-1956, 59 y.o.   MRN: [id number]  HPI Hypokalemia- noted on labs done in the ER (2.9).  Pt was given K+ in ER but not currently taking daily supplement.  No CP, SOB, HAs, visual changes, edema, cramps.  Vit D- pt completed 50,000 units x12 weeks and continues OTC daily supplement and would like to know if level has normalized.  Denies fatigue.   Review of Systems For ROS see HPI     Objective:   Physical Exam  Constitutional: She is oriented to person, place, and time. She appears well-developed and well-nourished. No distress.  HENT:  Head: Normocephalic and atraumatic.  Eyes: Conjunctivae and EOM are normal. Pupils are equal, round, and reactive to light.  Neck: Normal range of motion. Neck supple. No thyromegaly present.  Cardiovascular: Normal rate, regular rhythm, normal heart sounds and intact distal pulses.   No murmur heard. Pulmonary/Chest: Effort normal and breath sounds normal. No respiratory distress.  Abdominal: Soft. She exhibits no distension. There is no tenderness.  Musculoskeletal: She exhibits no edema.  Lymphadenopathy:    She has no cervical adenopathy.  Neurological: She is alert and oriented to person, place, and time.  Skin: Skin is warm and dry.  Psychiatric: She has a normal mood and affect. Her behavior is normal.  Vitals reviewed.         Assessment & Plan:

## 2015-04-16 NOTE — Patient Instructions (Signed)
Schedule your complete physical in 3-4 months We'll notify you of your lab results and make any changes if needed Keep up the good work!  You look great! Call with any questions or concerns Have a wonderful and safe trip!!!

## 2015-04-19 ENCOUNTER — Encounter (HOSPITAL_BASED_OUTPATIENT_CLINIC_OR_DEPARTMENT_OTHER): Payer: Self-pay | Admitting: *Deleted

## 2015-04-19 ENCOUNTER — Emergency Department (HOSPITAL_BASED_OUTPATIENT_CLINIC_OR_DEPARTMENT_OTHER)
Admission: EM | Admit: 2015-04-19 | Discharge: 2015-04-19 | Disposition: A | Discharge status: Home / Self Care | Payer: Managed Care, Other (non HMO) | Attending: Emergency Medicine | Admitting: Emergency Medicine

## 2015-04-19 DIAGNOSIS — Z79899 Other long term (current) drug therapy: Secondary | ICD-10-CM | POA: Insufficient documentation

## 2015-04-19 DIAGNOSIS — Z0000 Encounter for general adult medical examination without abnormal findings: Secondary | ICD-10-CM | POA: Diagnosis not present

## 2015-04-19 DIAGNOSIS — I10 Essential (primary) hypertension: Secondary | ICD-10-CM | POA: Diagnosis not present

## 2015-04-19 DIAGNOSIS — E119 Type 2 diabetes mellitus without complications: Secondary | ICD-10-CM | POA: Diagnosis not present

## 2015-04-19 DIAGNOSIS — Z950 Presence of cardiac pacemaker: Secondary | ICD-10-CM | POA: Diagnosis not present

## 2015-04-19 DIAGNOSIS — Z794 Long term (current) use of insulin: Secondary | ICD-10-CM | POA: Insufficient documentation

## 2015-04-19 DIAGNOSIS — R7989 Other specified abnormal findings of blood chemistry: Secondary | ICD-10-CM | POA: Diagnosis present

## 2015-04-19 DIAGNOSIS — Z86711 Personal history of pulmonary embolism: Secondary | ICD-10-CM | POA: Diagnosis not present

## 2015-04-19 LAB — BASIC METABOLIC PANEL
ANION GAP: 9 (ref 5–15)
BUN: 19 mg/dL (ref 6–20)
CHLORIDE: 105 mmol/L (ref 101–111)
CO2: 26 mmol/L (ref 22–32)
Calcium: 10.1 mg/dL (ref 8.9–10.3)
Creatinine, Ser: 1.06 mg/dL — ABNORMAL HIGH (ref 0.44–1.00)
GFR calc Af Amer: >60 mL/min (ref >60)
GFR calc non Af Amer: 57 mL/min — ABNORMAL LOW (ref >60)
Glucose, Bld: 148 mg/dL — ABNORMAL HIGH (ref 65–99)
Potassium: 3.5 mmol/L (ref 3.5–5.1)
SODIUM: 140 mmol/L (ref 135–145)

## 2015-04-19 NOTE — ED Provider Notes (Signed)
CSN: [id number]     Arrival date & time 04/19/15  1020 History   First MD Initiated Contact with Patient 04/19/15 1023     Chief Complaint  Patient presents with  . Abnormal Lab      HPI  Patient presents for evaluation after being called by her primary care physician that a blood sample obtained yesterday showed a potassium of 8.0 and no hemolysis. Patient states she feels fine. Evaluated in the ER several weeks ago and had a potassium low at 2.9. Is not on a potassium supplement. History of diabetes. No history of renal insufficiency.  Past Medical History  Diagnosis Date  . Diabetes mellitus   . Hypertension   . Chest pressure, resolved 01/22/2012  . DM (diabetes mellitus) 01/22/2012  . Hypokalemia 01/22/2012  . Dysrhythmia   . 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 History of pulmonary embolism   . Hyperlipidemia    Past Surgical History  Procedure Laterality Date  . Pacemaker insertion  08/2004    [facility] [place]  . Abdominal hysterectomy  07/2002  . Hernia repair    . Insert / replace / remove pacemaker     Family History  Problem Relation Age of Onset  . Coronary artery disease Father   . Heart attack Father   . Diabetes Sister   . Arthritis Sister   . Breast cancer Sister   . Breast cancer Mother   . Alzheimer's disease Mother   . Hypertension Mother    History  Substance Use Topics  . Smoking status: Never Smoker   . Smokeless tobacco: Never Used  . Alcohol Use: No   OB History    No data available     Review of Systems  Constitutional: Negative for fever, chills, diaphoresis, appetite change and fatigue.  HENT: Negative for mouth sores, sore throat and trouble swallowing.   Eyes: Negative for visual disturbance.  Respiratory: Negative for cough, chest tightness, shortness of breath and wheezing.   Cardiovascular: Negative for chest pain.  Gastrointestinal: Negative for nausea, vomiting, abdominal pain, diarrhea and  abdominal distention.  Endocrine: Negative for polydipsia, polyphagia and polyuria.  Genitourinary: Negative for dysuria, frequency and hematuria.  Musculoskeletal: Negative for gait problem.  Skin: Negative for color change, pallor and rash.  Neurological: Negative for dizziness, syncope, light-headedness and headaches.  Hematological: Does not bruise/bleed easily.  Psychiatric/Behavioral: Negative for behavioral problems and confusion.      Allergies  Review of patient's allergies indicates no known allergies.  Home Medications   Prior to Admission medications   Medication Sig Start Date End Date Taking? Authorizing Provider  amlodipine-atorvastatin (CADUET) 10-20 MG per tablet TAKE 1 TABLET DAILY FOR BLOOD PRESSURE 11/08/14   Sheliah Hatch, MD  BD PEN NEEDLE NANO U/F 32G X 4 MM MISC  09/15/14   Historical Provider, MD  Insulin Glargine (LANTUS SOLOSTAR) 100 UNIT/ML Solostar Pen Inject 32 Units into the skin daily. 09/11/14   Sheliah Hatch, MD  metFORMIN (GLUCOPHAGE) 500 MG tablet TAKE 1 TABLET BY MOUTH TWICE A DAY WITH MEALS 12/18/14   Sheliah Hatch, MD  nebivolol (BYSTOLIC) 10 MG tablet Take 1 tablet (10 mg total) by mouth at bedtime. 04/08/15   Mihai Croitoru, MD  ONETOUCH VERIO test strip Pt tests sugars twice daily. 08/01/13   Historical Provider, MD  TANZEUM 30 MG PEN Inject 30 mg into the skin every 7 (seven) days. Take on Sunday 03/07/15  Historical Provider, MD  valsartan-hydrochlorothiazide (DIOVAN-HCT) 320-12.5 MG per tablet Take 1 tablet by mouth daily. 04/08/15   Mihai Croitoru, MD   BP 146/89 mmHg  Pulse 92  Temp(Src) 98.4 F (36.9 C) (Oral)  Resp 18  SpO2 100% Physical Exam  Constitutional: She appears well-developed and well-nourished.  HENT:  Head: Normocephalic.  Eyes: Pupils are equal, round, and reactive to light.  Neck: Normal range of motion.  Cardiovascular: Normal rate.   Sinus rhythm on monitor. No ectopy.  Pulmonary/Chest:  Clear  bilateral breath sounds.    ED Course  Procedures (including critical care time) Labs Review Labs Reviewed  BASIC METABOLIC PANEL - Abnormal; Notable for the following:    Glucose, Bld 148 (*)    Creatinine, Ser 1.06 (*)    GFR calc non Af Amer 57 (*)    All other components within normal limits    Imaging Review No results found.   EKG Interpretation None      MDM   Final diagnoses:  Normal physical exam    Recheck potassium is  3.5. Patient discharged home.    Rolland PorterMark Vesta Wheeland, MD 04/19/15 1159

## 2015-04-19 NOTE — ED Notes (Signed)
MD at bedside. 

## 2015-04-19 NOTE — Discharge Instructions (Signed)
Your potassium was normal at 3.5 today.  No specific instructions  Follow up with your physician as needed or scheduled.

## 2015-04-19 NOTE — ED Notes (Signed)
Patient called by primary MD this morning and instructed to come to ER due to results of labs drawn on Wednesday. Per patient she ws told K+  8.0

## 2015-04-19 NOTE — ED Notes (Signed)
Pt presents to ED today, due to primary MD states she had elevated K level noted on recent blood work

## 2015-04-21 ENCOUNTER — Emergency Department (HOSPITAL_COMMUNITY)
Admission: EM | Admit: 2015-04-21 | Discharge: 2015-04-21 | Disposition: A | Discharge status: Home / Self Care | Payer: Managed Care, Other (non HMO) | Attending: Emergency Medicine | Admitting: Emergency Medicine

## 2015-04-21 ENCOUNTER — Emergency Department (HOSPITAL_COMMUNITY): Payer: Managed Care, Other (non HMO)

## 2015-04-21 ENCOUNTER — Telehealth: Payer: Self-pay | Admitting: Physician Assistant

## 2015-04-21 ENCOUNTER — Encounter (HOSPITAL_COMMUNITY): Payer: Self-pay | Admitting: *Deleted

## 2015-04-21 DIAGNOSIS — I10 Essential (primary) hypertension: Secondary | ICD-10-CM | POA: Diagnosis not present

## 2015-04-21 DIAGNOSIS — Z86711 Personal history of pulmonary embolism: Secondary | ICD-10-CM | POA: Insufficient documentation

## 2015-04-21 DIAGNOSIS — Z950 Presence of cardiac pacemaker: Secondary | ICD-10-CM | POA: Diagnosis not present

## 2015-04-21 DIAGNOSIS — E119 Type 2 diabetes mellitus without complications: Secondary | ICD-10-CM | POA: Insufficient documentation

## 2015-04-21 DIAGNOSIS — Z794 Long term (current) use of insulin: Secondary | ICD-10-CM | POA: Diagnosis not present

## 2015-04-21 DIAGNOSIS — R197 Diarrhea, unspecified: Secondary | ICD-10-CM | POA: Diagnosis not present

## 2015-04-21 DIAGNOSIS — Z79899 Other long term (current) drug therapy: Secondary | ICD-10-CM | POA: Diagnosis not present

## 2015-04-21 DIAGNOSIS — R002 Palpitations: Secondary | ICD-10-CM

## 2015-04-21 LAB — CBC
HCT: 41.3 % (ref 36.0–46.0)
Hemoglobin: 13.9 g/dL (ref 12.0–15.0)
MCH: 27.5 pg (ref 26.0–34.0)
MCHC: 33.7 g/dL (ref 30.0–36.0)
MCV: 81.8 fL (ref 78.0–100.0)
PLATELETS: 269 10*3/uL (ref 150–400)
RBC: 5.05 MIL/uL (ref 3.87–5.11)
RDW: 14.7 % (ref 11.5–15.5)
WBC: 6.7 10*3/uL (ref 4.0–10.5)

## 2015-04-21 LAB — BASIC METABOLIC PANEL
Anion gap: 9 (ref 5–15)
BUN: 15 mg/dL (ref 6–20)
CALCIUM: 9.5 mg/dL (ref 8.9–10.3)
CO2: 24 mmol/L (ref 22–32)
Chloride: 105 mmol/L (ref 101–111)
Creatinine, Ser: 0.91 mg/dL (ref 0.44–1.00)
GFR calc Af Amer: >60 mL/min (ref >60)
GFR calc non Af Amer: >60 mL/min (ref >60)
Glucose, Bld: 129 mg/dL — ABNORMAL HIGH (ref 65–99)
Potassium: 4.1 mmol/L (ref 3.5–5.1)
SODIUM: 138 mmol/L (ref 135–145)

## 2015-04-21 LAB — D-DIMER, QUANTITATIVE (NOT AT ARMC): D DIMER QUANT: 0.37 ug{FEU}/mL (ref 0.00–0.48)

## 2015-04-21 LAB — I-STAT TROPONIN, ED: Troponin i, poc: 0 ng/mL (ref 0.00–0.08)

## 2015-04-21 LAB — BRAIN NATRIURETIC PEPTIDE: B Natriuretic Peptide: 35.2 pg/mL (ref 0.0–100.0)

## 2015-04-21 NOTE — ED Notes (Signed)
[employer] representative will come interrogate pacemaker.

## 2015-04-21 NOTE — Discharge Instructions (Signed)
Please follow with your primary care doctor in the next 2 days for a check-up. They must obtain records for further management.  ° °Do not hesitate to return to the Emergency Department for any new, worsening or concerning symptoms.  ° °

## 2015-04-21 NOTE — ED Notes (Signed)
[employer] representative at bedside.

## 2015-04-21 NOTE — Telephone Encounter (Signed)
Patient called holiday answering service. Has been plagued by recent issues with dizziness and palpitations. Seen in ER 5/9 for this and pacer interrogation reportedly without episodes of tachycardia. I do not have the full report to see if there was anything else going on. She reports while standing, her heart races with palpitations and dizziness. She has to lay down and put her feet up to avoid passing out. No SOB. This is becoming more frequent. Recent issues with K+ levels. We discussed options. Since this issue is becoming more frequent she has elected to proceed to ER for further evaluation. She feels this is happening often enough that she should be able to reproduce symptoms here while being monitored. We may need EP input today to see if there is anything else from a PPM standpoint that could be causing her symptoms. If not she will need alternative workup for postural dizziness. She was instructed not to drive herself with dizziness sx.  Delawrence Fridman PA-C

## 2015-04-21 NOTE — ED Notes (Signed)
Pt in c/o palpitations that started yesterday, symptoms resolved after resting, another episode started this morning and lasted longer, pt also reported diarrhea, recently had blood work at PCP office and potassium level was abnormal

## 2015-04-21 NOTE — ED Provider Notes (Signed)
CSN: [id number]     Arrival date & time 04/21/15  9562 History   First MD Initiated Contact with Patient 04/21/15 0945     Chief Complaint  Patient presents with  . Palpitations  . Diarrhea     (Consider location/radiation/quality/duration/timing/severity/associated sxs/prior Treatment) HPI   Lauren Wilkins is a 59 y.o. female complaining of palpitations described as heart racing onset yesterday, patient states she laid down and raise her legs and the palpitations ceased, she had another episode this morning. She states that she can "hear her heartbeat when she lays down." States that when she sits up it alleviates the sensation. She denies chest pain, shortness of breath, diaphoresis, nausea vomiting, she has a history of PE in the remote past, she is not anticoagulated, she takes no exhaustion as [place] and, denies recent trips, immobilizations, calf pain, leg swelling. Patient was seen for similar several weeks ago, she followed with Dr. Salena Saner (patient has a tool pacemaker), has not had any Holter monitoring. She reports 2 episodes of loose stool over the last 24 hours. She denies abdominal pain.  Past Medical History  Diagnosis Date  . Diabetes mellitus   . Hypertension   . Chest pressure, resolved 01/22/2012  . DM (diabetes mellitus) 01/22/2012  . Hypokalemia 01/22/2012  . Dysrhythmia   . 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 History of pulmonary embolism   . Hyperlipidemia    Past Surgical History  Procedure Laterality Date  . Pacemaker insertion  08/2004    [facility] [place]  . Abdominal hysterectomy  07/2002  . Hernia repair    . Insert / replace / remove pacemaker     Family History  Problem Relation Age of Onset  . Coronary artery disease Father   . Heart attack Father   . Diabetes Sister   . Arthritis Sister   . Breast cancer Sister   . Breast cancer Mother   . Alzheimer's disease Mother   . Hypertension Mother    History  Substance  Use Topics  . Smoking status: Never Smoker   . Smokeless tobacco: Never Used  . Alcohol Use: No   OB History    No data available     Review of Systems   10 systems reviewed and found to be negative, except as noted in the HPI.  Allergies  Review of patient's allergies indicates no known allergies.  Home Medications   Prior to Admission medications   Medication Sig Start Date End Date Taking? Authorizing Provider  amlodipine-atorvastatin (CADUET) 10-20 MG per tablet TAKE 1 TABLET DAILY FOR BLOOD PRESSURE 11/08/14   Sheliah Hatch, MD  BD PEN NEEDLE NANO U/F 32G X 4 MM MISC  09/15/14   Historical Provider, MD  Insulin Glargine (LANTUS SOLOSTAR) 100 UNIT/ML Solostar Pen Inject 32 Units into the skin daily. 09/11/14   Sheliah Hatch, MD  metFORMIN (GLUCOPHAGE) 500 MG tablet TAKE 1 TABLET BY MOUTH TWICE A DAY WITH MEALS 12/18/14   Sheliah Hatch, MD  nebivolol (BYSTOLIC) 10 MG tablet Take 1 tablet (10 mg total) by mouth at bedtime. 04/08/15   Mihai Croitoru, MD  ONETOUCH VERIO test strip Pt tests sugars twice daily. 08/01/13   Historical Provider, MD  TANZEUM 30 MG PEN Inject 30 mg into the skin every 7 (seven) days. Take on Sunday 03/07/15   Historical Provider, MD  valsartan-hydrochlorothiazide (DIOVAN-HCT) 320-12.5 MG per tablet Take 1 tablet by mouth daily. 04/08/15  Mihai Croitoru, MD   BP 138/91 mmHg  Pulse 69  Temp(Src) 98.4 F (36.9 C) (Oral)  Resp 20  SpO2 99% Physical Exam  Constitutional: She is oriented to person, place, and time. She appears well-developed and well-nourished. No distress.  HENT:  Head: Normocephalic.  Mouth/Throat: Oropharynx is clear and moist.  Eyes: Conjunctivae are normal.  Neck: Normal range of motion. No JVD present. No tracheal deviation present.  Cardiovascular: Normal rate, regular rhythm and intact distal pulses.   Radial pulse equal bilaterally  Pulmonary/Chest: Effort normal and breath sounds normal. No stridor. No respiratory  distress. She has no wheezes. She has no rales. She exhibits no tenderness.  Abdominal: Soft. She exhibits no distension and no mass. There is no tenderness. There is no rebound and no guarding.  Musculoskeletal: Normal range of motion. She exhibits no edema or tenderness.  No calf asymmetry, superficial collaterals, palpable cords, edema, Homans sign negative bilaterally.    Neurological: She is alert and oriented to person, place, and time.  Skin: Skin is warm. She is not diaphoretic.  Psychiatric: She has a normal mood and affect.  Nursing note and vitals reviewed.   ED Course  Procedures (including critical care time) Labs Review Labs Reviewed  BASIC METABOLIC PANEL - Abnormal; Notable for the following:    Glucose, Bld 129 (*)    All other components within normal limits  CBC  BRAIN NATRIURETIC PEPTIDE  D-DIMER, QUANTITATIVE (NOT [facility])  Rosezena SensorI-STAT TROPOININ, ED    Imaging Review Dg Chest Port 1 View  04/21/2015   CLINICAL DATA:  Cardiac palpitations and weakness  EXAM: PORTABLE CHEST - 1 VIEW  COMPARISON:  09/06/2014  FINDINGS: Cardiac shadow is stable. A pacing device is again seen. The lungs are clear bilaterally. No bony abnormality is seen.  IMPRESSION: No acute abnormality noted.   Electronically Signed   By: Alcide CleverMark  Lukens M.D.   On: 04/21/2015 10:04     EKG Interpretation   Date/Time:  Monday Apr 21 2015 09:42:53 EDT Ventricular Rate:  82 PR Interval:  164 QRS Duration: 81 QT Interval:  357 QTC Calculation: 417 R Axis:   -17 Text Interpretation:  Sinus rhythm Left ventricular hypertrophy No  significant change since last tracing Confirmed by Anitra LauthPLUNKETT  MD, WHITNEY  [phone]([id number]) on 04/21/2015 9:53:36 AM      MDM   Final diagnoses:  Palpitations   Filed Vitals:   04/21/15 1145 04/21/15 1215 04/21/15 1230 04/21/15 1245  BP: 143/90 146/98 141/87 138/91  Pulse: 76 78 75 69  Temp:      TempSrc:      Resp: 22 18 20 20   SpO2: 98% 100% 100% 99%     Lauren LefortGenetha Wilkins  is a pleasant 59 y.o. female presenting with sensation of palpitations, she describes a sensation of heart racing and also feeling her heartbeat in her ears. EKG today shows no tachycardia or arrhythmia. [facility] and heart rate has not reached above 130. Patient's dimer is negative, blood work with no abnormalities. Advised the patient to follow closely with her cardiologist.  Evaluation does not show pathology that would require ongoing emergent intervention or inpatient treatment. Pt is hemodynamically stable and mentating appropriately. Discussed findings and plan with patient/guardian, who agrees with care plan. All questions answered. Return precautions discussed and outpatient follow up given.      Wynetta Emeryicole Camie Hauss, PA-C 04/21/15 1308  Gwyneth SproutWhitney Plunkett, MD 04/23/15 2216

## 2015-04-22 ENCOUNTER — Telehealth: Payer: Self-pay | Admitting: Family Medicine

## 2015-04-22 ENCOUNTER — Telehealth: Payer: Self-pay | Admitting: Cardiovascular Disease

## 2015-04-22 DIAGNOSIS — R002 Palpitations: Secondary | ICD-10-CM

## 2015-04-22 NOTE — Telephone Encounter (Signed)
Pt reports that she had palpitations over the weekend, Sunday and Monday. Monday AM she went to ER for evaluation.  She states that she gets relief from palps by lying down on floor and elevating feet.  Concern had been this being related to low potassium We discussed med changes at OV w/ Dr. Salena Saner 2 weeks ago where hypokalemia was addressed - noted recent labwork shows K normal, she is still having the palps.  She denies any dyspnea w/ this. No pain, other symptoms.  Advised to call us if palpitations return - she is not having them now. In interim, would defer for Dr. Renaye Rakers's advice on this.

## 2015-04-22 NOTE — Telephone Encounter (Signed)
Lab results received via fax from [facility] Forwarded to Cendant CorporationJessica T. JG//CMA

## 2015-04-22 NOTE — Telephone Encounter (Signed)
Ms. Lauren Wilkins is calling because she is having to keep going to the ED for the papiltations , which she states are coming very frequently .  Would like to speak to someone about it. Please call    Thanks

## 2015-04-22 NOTE — Telephone Encounter (Signed)
Spoke with pt and advised that we do not have the lab results from that day back yet.   Pt stated that someone called her this weekend and told her that her potassium level was 8.0 and that she needed to go to the emergency room ASAP. Pt stated this "scared the dickens out of her", when she went to ED her potassium was 3.5.  Number that the caller called from was [phone] called and reached team health. No phone note in the system to verify critical lab or that pt was advised to go to the ED. Our lab is calling [facility] to receive the results and verify the critical level.

## 2015-04-22 NOTE — Telephone Encounter (Signed)
Relation to pt: self  Call back number: [phone]  Reason for call:  Pt inquiring about lab results taken 04/16/15 pt states if results are posted on MyChart pt states she can not view. Please advise Thank you

## 2015-04-23 NOTE — Telephone Encounter (Signed)
Called pt and notified of provider's comments pt expressed an understanding. Pt inquired about her vitamin d levels. Toldpt that I would ask the provider as no mention was made as to those results. sheena is calling to receive those records for me again.

## 2015-04-23 NOTE — Telephone Encounter (Signed)
Please advise on vitamin D level is 30.0. Labs placed on your counter.

## 2015-04-23 NOTE — Telephone Encounter (Signed)
Vit D level of 30 is normal- low end of normal- but normal.  Start or continue daily OTC supplement of at least 2,000 units

## 2015-04-23 NOTE — Telephone Encounter (Signed)
Pt requesting to speak with office manager best [phone]

## 2015-04-23 NOTE — Telephone Encounter (Signed)
Advised pt that per provider pt is to continue her 2,000 iu vitamin d supplement. Pt stated an understanding.

## 2015-04-23 NOTE — Telephone Encounter (Signed)
Please advise if anything further needs to be done for pt? Lab results were placed in your red folder.

## 2015-04-23 NOTE — Telephone Encounter (Signed)
No further instructions on lab work.  K+ was found to be high initially but when repeated in ER was normal.  No changes at this time

## 2015-04-23 NOTE — Telephone Encounter (Signed)
Pt called again for lab results. Best # [phone]

## 2015-04-23 NOTE — Telephone Encounter (Signed)
Sent to Clinical Nurse Manager for [employer]

## 2015-04-25 NOTE — Telephone Encounter (Signed)
Please have her wear a 24 hour Holter monitor

## 2015-04-25 NOTE — Telephone Encounter (Signed)
Spoke to patient, discussed symptoms. Pt set up for 24 hr holter monitor appt.

## 2015-04-25 NOTE — Telephone Encounter (Signed)
[facility]

## 2015-04-25 NOTE — Addendum Note (Signed)
Addended by: Ladell HeadsOSE, NATHAN R on: 04/25/2015 10:25 AM   Modules accepted: Orders

## 2015-07-05 ENCOUNTER — Other Ambulatory Visit: Payer: Self-pay | Admitting: Family Medicine

## 2015-07-07 NOTE — Telephone Encounter (Signed)
Medication filled to pharmacy as requested.   

## 2015-07-27 ENCOUNTER — Other Ambulatory Visit: Payer: Self-pay | Admitting: Family Medicine

## 2015-07-29 NOTE — Telephone Encounter (Signed)
Medication filled to pharmacy as requested.   

## 2015-08-14 ENCOUNTER — Telehealth: Payer: Self-pay | Admitting: General Practice

## 2015-08-14 MED ORDER — AMLODIPINE-ATORVASTATIN 10-20 MG PO TABS: 1.0000 | ORAL_TABLET | Freq: Every day | ORAL | Status: DC

## 2015-08-14 NOTE — Telephone Encounter (Signed)
Cannot fill for #90 until pt has a CPE.

## 2015-10-15 ENCOUNTER — Ambulatory Visit: Payer: Managed Care, Other (non HMO) | Admitting: Family Medicine

## 2015-10-23 ENCOUNTER — Ambulatory Visit (INDEPENDENT_AMBULATORY_CARE_PROVIDER_SITE_OTHER): Payer: Managed Care, Other (non HMO) | Admitting: *Deleted

## 2015-10-23 DIAGNOSIS — I455 Other specified heart block: Secondary | ICD-10-CM

## 2015-10-23 LAB — CUP PACEART INCLINIC DEVICE CHECK
Implantable Lead Implant Date: 20051013
Implantable Lead Location: 753859
Implantable Lead Location: 753860
Lead Channel Impedance Value: 442 Ohm
Lead Channel Pacing Threshold Pulse Width: 0.6 ms
Lead Channel Sensing Intrinsic Amplitude: 8.7 mV
Lead Channel Setting Pacing Amplitude: 2 V
Lead Channel Setting Pacing Pulse Width: 0.6 ms
MDC IDC LEAD IMPLANT DT: 20051013
MDC IDC MSMT BATTERY IMPEDANCE: 2000 Ohm
MDC IDC MSMT BATTERY VOLTAGE: 2.76 V
MDC IDC MSMT LEADCHNL RA IMPEDANCE VALUE: 439 Ohm
MDC IDC MSMT LEADCHNL RA PACING THRESHOLD AMPLITUDE: 0.75 V
MDC IDC MSMT LEADCHNL RA PACING THRESHOLD PULSEWIDTH: 0.4 ms
MDC IDC MSMT LEADCHNL RA SENSING INTR AMPL: 2 mV
MDC IDC MSMT LEADCHNL RV PACING THRESHOLD AMPLITUDE: 1.5 V
MDC IDC PG SERIAL: 1357232
MDC IDC SESS DTM: 20161201103134
MDC IDC SET LEADCHNL RV PACING AMPLITUDE: 3 V
MDC IDC SET LEADCHNL RV SENSING SENSITIVITY: 2 mV
Pulse Gen Model: 5356

## 2015-10-23 NOTE — Progress Notes (Signed)
Pacemaker check in clinic. Normal device function. Thresholds, sensing, impedances consistent with previous measurements. Device programmed to maximize longevity. No mode switch or high ventricular rates noted. RV output increased from 2.5V to 3.0V. Histogram distribution appropriate for patient activity level. Device programmed to optimize intrinsic conduction. Estimated longevity 3.5-6.75 years. [facility]

## 2015-10-24 ENCOUNTER — Telehealth: Payer: Self-pay | Admitting: Family Medicine

## 2015-10-24 ENCOUNTER — Ambulatory Visit: Payer: Managed Care, Other (non HMO) | Admitting: Family Medicine

## 2015-10-24 NOTE — Telephone Encounter (Signed)
Pt lvm at 7:26 cancelling her 10:45 appt because she is on her way out of town she has had death in the family. She will reschedule when she gets back in town.

## 2015-10-29 NOTE — Telephone Encounter (Signed)
#   NS in last year: 3rd no show since 11/2014  Charge or No Charge?

## 2015-10-29 NOTE — Telephone Encounter (Signed)
Yes- charge as this is a recurring theme for pt

## 2015-10-29 NOTE — Telephone Encounter (Signed)
Disregard, per provider no NS fee.

## 2015-11-07 ENCOUNTER — Telehealth: Payer: Self-pay | Admitting: Family Medicine

## 2015-11-07 ENCOUNTER — Ambulatory Visit (INDEPENDENT_AMBULATORY_CARE_PROVIDER_SITE_OTHER): Payer: Managed Care, Other (non HMO) | Admitting: Family Medicine

## 2015-11-07 ENCOUNTER — Other Ambulatory Visit: Payer: Self-pay | Admitting: General Practice

## 2015-11-07 ENCOUNTER — Encounter: Payer: Self-pay | Admitting: Family Medicine

## 2015-11-07 ENCOUNTER — Other Ambulatory Visit: Payer: Self-pay | Admitting: Family Medicine

## 2015-11-07 VITALS — BP 140/82 | HR 69 | Temp 98.1°F | Resp 18 | Ht 66.0 in | Wt 249.5 lb

## 2015-11-07 DIAGNOSIS — E119 Type 2 diabetes mellitus without complications: Secondary | ICD-10-CM | POA: Diagnosis not present

## 2015-11-07 DIAGNOSIS — Z794 Long term (current) use of insulin: Secondary | ICD-10-CM

## 2015-11-07 DIAGNOSIS — E785 Hyperlipidemia, unspecified: Secondary | ICD-10-CM | POA: Diagnosis not present

## 2015-11-07 DIAGNOSIS — I10 Essential (primary) hypertension: Secondary | ICD-10-CM

## 2015-11-07 DIAGNOSIS — E7800 Pure hypercholesterolemia, unspecified: Secondary | ICD-10-CM | POA: Insufficient documentation

## 2015-11-07 LAB — LIPID PANEL
CHOL/HDL RATIO: 6
Cholesterol: 264 mg/dL — ABNORMAL HIGH (ref 0–200)
HDL: 45.1 mg/dL (ref >39.00)
LDL Cholesterol: 185 mg/dL — ABNORMAL HIGH (ref 0–99)
NONHDL: 218.84
Triglycerides: 167 mg/dL — ABNORMAL HIGH (ref 0.0–149.0)
VLDL: 33.4 mg/dL (ref 0.0–40.0)

## 2015-11-07 LAB — CBC WITH DIFFERENTIAL/PLATELET
BASOS PCT: 0.4 % (ref 0.0–3.0)
Basophils Absolute: 0 10*3/uL (ref 0.0–0.1)
EOS PCT: 0.7 % (ref 0.0–5.0)
Eosinophils Absolute: 0 10*3/uL (ref 0.0–0.7)
HEMATOCRIT: 44.3 % (ref 36.0–46.0)
HEMOGLOBIN: 14.3 g/dL (ref 12.0–15.0)
Lymphocytes Relative: 38.2 % (ref 12.0–46.0)
Lymphs Abs: 2.6 10*3/uL (ref 0.7–4.0)
MCHC: 32.4 g/dL (ref 30.0–36.0)
MCV: 82.8 fl (ref 78.0–100.0)
MONO ABS: 0.4 10*3/uL (ref 0.1–1.0)
MONOS PCT: 6.5 % (ref 3.0–12.0)
Neutro Abs: 3.6 10*3/uL (ref 1.4–7.7)
Neutrophils Relative %: 54.2 % (ref 43.0–77.0)
Platelets: 303 10*3/uL (ref 150.0–400.0)
RBC: 5.34 Mil/uL — ABNORMAL HIGH (ref 3.87–5.11)
RDW: 15.3 % (ref 11.5–15.5)
WBC: 6.7 10*3/uL (ref 4.0–10.5)

## 2015-11-07 LAB — BASIC METABOLIC PANEL
BUN: 16 mg/dL (ref 6–23)
CHLORIDE: 103 meq/L (ref 96–112)
CO2: 26 mEq/L (ref 19–32)
CREATININE: 1.12 mg/dL (ref 0.40–1.20)
Calcium: 10.2 mg/dL (ref 8.4–10.5)
GFR: 64.34 mL/min (ref >60.00)
Glucose, Bld: 142 mg/dL — ABNORMAL HIGH (ref 70–99)
POTASSIUM: 3.4 meq/L — AB (ref 3.5–5.1)
Sodium: 137 mEq/L (ref 135–145)

## 2015-11-07 LAB — HEPATIC FUNCTION PANEL
ALT: 40 U/L — AB (ref 0–35)
AST: 30 U/L (ref 0–37)
Albumin: 4.1 g/dL (ref 3.5–5.2)
Alkaline Phosphatase: 115 U/L (ref 39–117)
BILIRUBIN DIRECT: 0.1 mg/dL (ref 0.0–0.3)
BILIRUBIN TOTAL: 0.4 mg/dL (ref 0.2–1.2)
Total Protein: 7.5 g/dL (ref 6.0–8.3)

## 2015-11-07 LAB — TSH: TSH: 2.45 u[IU]/mL (ref 0.35–4.50)

## 2015-11-07 MED ORDER — AMLODIPINE-ATORVASTATIN 10-40 MG PO TABS: 1.0000 | ORAL_TABLET | Freq: Every day | ORAL | Status: DC

## 2015-11-07 NOTE — Progress Notes (Signed)
Pre visit review using our clinic review tool, if applicable. No additional management support is needed unless otherwise documented below in the visit note. 

## 2015-11-07 NOTE — Patient Instructions (Signed)
Schedule your complete physical with your new provider in 6 months We'll notify you of your lab results and make any changes if needed Schedule your eye exam! Continue to work on healthy diet and add in regular exercise- you can do it! Call with any questions or concerns Happy Holidays!!!

## 2015-11-07 NOTE — Assessment & Plan Note (Signed)
Ongoing issue for pt.  She has lost 5 lbs since recent visit.  Applauded her efforts and encouraged her to continue.  Will continue to follow.

## 2015-11-07 NOTE — Telephone Encounter (Signed)
error 

## 2015-11-07 NOTE — Assessment & Plan Note (Signed)
Chronic problem.  Endo has adjusted BP meds recently due to elevated readings.  Pt reports today's reading is much better than previous.  Asymptomatic.  Check labs.  No anticipated med changes at this time.

## 2015-11-07 NOTE — Progress Notes (Signed)
   Subjective:    Patient ID: Lauren LefortGenetha Wilkins, female    DOB: 04/28/1956, 59 y.o.   MRN: [id number]  HPI HTN- chronic problem, on Amlodipine, Bystolic, Valsartan-HCTZ daily.  No CP, SOB, HAs, visual changes, edema.  Hyperlipidemia- chronic problem, on Atorvastatin.  No abd pain, N/V, myalgias.  Obesity- chronic problem, BMI is 40.  Has lost 5 lbs since last visit.  No exercise.  Stopped drinking sodas.  Pt reports she plans to restart exercise after the holiday.  DM- following w/ [facility], has appt the end of this month.  Overdue for eye exam- pt plans to schedule.   Review of Systems For ROS see HPI     Objective:   Physical Exam  Constitutional: She is oriented to person, place, and time. She appears well-developed and well-nourished. No distress.  obese  HENT:  Head: Normocephalic and atraumatic.  Eyes: Conjunctivae and EOM are normal. Pupils are equal, round, and reactive to light.  Neck: Normal range of motion. Neck supple. No thyromegaly present.  Cardiovascular: Normal rate, regular rhythm, normal heart sounds and intact distal pulses.   No murmur heard. Pulmonary/Chest: Effort normal and breath sounds normal. No respiratory distress.  Abdominal: Soft. She exhibits no distension. There is no tenderness.  Musculoskeletal: She exhibits no edema.  Lymphadenopathy:    She has no cervical adenopathy.  Neurological: She is alert and oriented to person, place, and time.  Skin: Skin is warm and dry.  Psychiatric: She has a normal mood and affect. Her behavior is normal.  Vitals reviewed.         Assessment & Plan:

## 2015-11-07 NOTE — Assessment & Plan Note (Signed)
Chronic problem.  Tolerating statin w/o difficulty.  Stressed need for healthy diet and regular exercise.  Check labs.  Adjust meds prn  

## 2015-11-07 NOTE — Assessment & Plan Note (Signed)
Chronic problem.  Following w/ [facility]- has appt at end of the month.  Due for eye exam- pt plans to schedule.  UTD on foot exam.  On ARB for renal protection.

## 2015-11-10 NOTE — Telephone Encounter (Signed)
Medication filled to pharmacy as requested.   

## 2015-11-13 ENCOUNTER — Encounter: Payer: Self-pay | Admitting: Cardiovascular Disease

## 2015-12-05 ENCOUNTER — Other Ambulatory Visit: Payer: Self-pay

## 2015-12-05 ENCOUNTER — Telehealth: Payer: Self-pay | Admitting: Cardiovascular Disease

## 2015-12-05 ENCOUNTER — Telehealth: Payer: Self-pay | Admitting: Family Medicine

## 2015-12-05 MED ORDER — AMLODIPINE BESYLATE 10 MG PO TABS: 10.0000 mg | ORAL_TABLET | Freq: Every day | ORAL | Status: DC

## 2015-12-05 MED ORDER — ATORVASTATIN CALCIUM 40 MG PO TABS: 40.0000 mg | ORAL_TABLET | Freq: Every day | ORAL | Status: DC

## 2015-12-05 NOTE — Telephone Encounter (Signed)
Please call,concerning the generic for Caudet.

## 2015-12-05 NOTE — Telephone Encounter (Signed)
Pt states that she went to pick up a blood pressure medication and her insurance does not cover the brand name. Pt would like to know if she could have a generic blood pressure medication. The medication is Caduet 10-40mg  tablet.  Last OV was 11/07/15.  Last BP was 140/82.  Please advise.

## 2015-12-05 NOTE — Telephone Encounter (Signed)
Caller name: Willadean CarolGenetha  Relationship to patient:Self   Can be reached: [phone]   Reason for call: Pt is requesting a call back from CMA  Or PCP directly. Pt didn't want to go into detail.

## 2015-12-05 NOTE — Telephone Encounter (Signed)
We can split the Caduet into the 2 components (Lipitor and Amlodipine are both generic) but the combo is not generic.  It will just mean taking 2 pills instead of 1

## 2015-12-05 NOTE — Telephone Encounter (Signed)
Patient said that her physician had [facility] [place], Neena RhymesKatherine Tabori MD and she needs pre-authorization for RX that she prescribed for her in December, "Caudet". Instructed patient to call back to the office and let them know you need help with filling a medication that she had prescribed.

## 2015-12-05 NOTE — Telephone Encounter (Signed)
Medications sent to pharmacy

## 2016-01-02 ENCOUNTER — Other Ambulatory Visit: Payer: Self-pay

## 2016-01-02 DIAGNOSIS — Z1231 Encounter for screening mammogram for malignant neoplasm of breast: Secondary | ICD-10-CM

## 2016-01-16 ENCOUNTER — Other Ambulatory Visit: Payer: Self-pay | Admitting: Family Medicine

## 2016-01-16 NOTE — Telephone Encounter (Signed)
Medication filled to pharmacy as requested.   

## 2016-01-23 ENCOUNTER — Inpatient Hospital Stay: Admission: RE | Admit: 2016-01-23 | Payer: Managed Care, Other (non HMO) | Source: Ambulatory Visit

## 2016-02-05 ENCOUNTER — Telehealth: Payer: Self-pay | Admitting: Family Medicine

## 2016-02-05 ENCOUNTER — Encounter (HOSPITAL_BASED_OUTPATIENT_CLINIC_OR_DEPARTMENT_OTHER): Payer: Self-pay

## 2016-02-05 ENCOUNTER — Ambulatory Visit: Payer: Managed Care, Other (non HMO) | Admitting: Family Medicine

## 2016-02-05 ENCOUNTER — Encounter: Payer: Self-pay | Admitting: General Practice

## 2016-02-05 DIAGNOSIS — E119 Type 2 diabetes mellitus without complications: Secondary | ICD-10-CM | POA: Diagnosis not present

## 2016-02-05 DIAGNOSIS — Z79899 Other long term (current) drug therapy: Secondary | ICD-10-CM | POA: Insufficient documentation

## 2016-02-05 DIAGNOSIS — Z794 Long term (current) use of insulin: Secondary | ICD-10-CM | POA: Diagnosis not present

## 2016-02-05 DIAGNOSIS — Z950 Presence of cardiac pacemaker: Secondary | ICD-10-CM | POA: Diagnosis not present

## 2016-02-05 DIAGNOSIS — E785 Hyperlipidemia, unspecified: Secondary | ICD-10-CM | POA: Insufficient documentation

## 2016-02-05 DIAGNOSIS — I10 Essential (primary) hypertension: Secondary | ICD-10-CM | POA: Insufficient documentation

## 2016-02-05 DIAGNOSIS — R1111 Vomiting without nausea: Secondary | ICD-10-CM | POA: Insufficient documentation

## 2016-02-05 DIAGNOSIS — Z86711 Personal history of pulmonary embolism: Secondary | ICD-10-CM | POA: Diagnosis not present

## 2016-02-05 DIAGNOSIS — R1110 Vomiting, unspecified: Secondary | ICD-10-CM | POA: Diagnosis present

## 2016-02-05 DIAGNOSIS — Z7984 Long term (current) use of oral hypoglycemic drugs: Secondary | ICD-10-CM | POA: Insufficient documentation

## 2016-02-05 LAB — URINALYSIS, ROUTINE W REFLEX MICROSCOPIC
GLUCOSE, UA: NEGATIVE mg/dL
HGB URINE DIPSTICK: NEGATIVE
KETONES UR: 15 mg/dL — AB
Nitrite: NEGATIVE
PH: 5.5 (ref 5.0–8.0)
PROTEIN: NEGATIVE mg/dL
Specific Gravity, Urine: 1.028 (ref 1.005–1.030)

## 2016-02-05 LAB — URINE MICROSCOPIC-ADD ON

## 2016-02-05 LAB — CBG MONITORING, ED: GLUCOSE-CAPILLARY: 180 mg/dL — AB (ref 65–99)

## 2016-02-05 NOTE — Telephone Encounter (Signed)
This makes 3rd no show + 1 cancellation

## 2016-02-05 NOTE — Telephone Encounter (Signed)
Pt left VM 3/16 8:02am stating she needs a later date and to cancel appt today, called and rescheduled for 3/20, pt said her blood sugar was high and had to go to her other doctor this morning, charge or no charge?

## 2016-02-05 NOTE — ED Notes (Signed)
Vomiting x 2 days-denies pain and diarrhea-NAD-steady gait-note reports pt cancelled PCP appt and did not see endocrinologist today

## 2016-02-05 NOTE — Telephone Encounter (Signed)
Pt see how many cancellations or no-shows pt has had

## 2016-02-05 NOTE — Telephone Encounter (Signed)
No charge but since this is her 3rd no show and 4th total cancellation this year, we will begin the dismissal process

## 2016-02-05 NOTE — Telephone Encounter (Signed)
Paperwork began and letter written, given to PCP for signature.

## 2016-02-06 ENCOUNTER — Encounter (HOSPITAL_BASED_OUTPATIENT_CLINIC_OR_DEPARTMENT_OTHER): Payer: Self-pay | Admitting: Emergency Medicine

## 2016-02-06 ENCOUNTER — Emergency Department (HOSPITAL_BASED_OUTPATIENT_CLINIC_OR_DEPARTMENT_OTHER): Payer: Managed Care, Other (non HMO)

## 2016-02-06 ENCOUNTER — Emergency Department (HOSPITAL_BASED_OUTPATIENT_CLINIC_OR_DEPARTMENT_OTHER)
Admission: EM | Admit: 2016-02-06 | Discharge: 2016-02-06 | Disposition: A | Discharge status: Home / Self Care | Payer: Managed Care, Other (non HMO) | Attending: Emergency Medicine | Admitting: Emergency Medicine

## 2016-02-06 ENCOUNTER — Telehealth: Payer: Self-pay | Admitting: General Practice

## 2016-02-06 DIAGNOSIS — R1111 Vomiting without nausea: Secondary | ICD-10-CM

## 2016-02-06 LAB — CBC WITH DIFFERENTIAL/PLATELET
BASOS ABS: 0 10*3/uL (ref 0.0–0.1)
BASOS PCT: 0 %
EOS ABS: 0 10*3/uL (ref 0.0–0.7)
EOS PCT: 0 %
HCT: 42.9 % (ref 36.0–46.0)
Hemoglobin: 14.6 g/dL (ref 12.0–15.0)
Lymphocytes Relative: 35 %
Lymphs Abs: 2.6 10*3/uL (ref 0.7–4.0)
MCH: 26.9 pg (ref 26.0–34.0)
MCHC: 34 g/dL (ref 30.0–36.0)
MCV: 79.2 fL (ref 78.0–100.0)
MONO ABS: 0.6 10*3/uL (ref 0.1–1.0)
Monocytes Relative: 8 %
NEUTROS PCT: 57 %
Neutro Abs: 4.3 10*3/uL (ref 1.7–7.7)
PLATELETS: 272 10*3/uL (ref 150–400)
RBC: 5.42 MIL/uL — AB (ref 3.87–5.11)
RDW: 14.7 % (ref 11.5–15.5)
WBC: 7.5 10*3/uL (ref 4.0–10.5)

## 2016-02-06 LAB — COMPREHENSIVE METABOLIC PANEL
ALBUMIN: 4 g/dL (ref 3.5–5.0)
ALT: 35 U/L (ref 14–54)
AST: 29 U/L (ref 15–41)
Alkaline Phosphatase: 123 U/L (ref 38–126)
Anion gap: 10 (ref 5–15)
BUN: 28 mg/dL — AB (ref 6–20)
CHLORIDE: 101 mmol/L (ref 101–111)
CO2: 22 mmol/L (ref 22–32)
Calcium: 9.6 mg/dL (ref 8.9–10.3)
Creatinine, Ser: 1.15 mg/dL — ABNORMAL HIGH (ref 0.44–1.00)
GFR calc Af Amer: >60 mL/min (ref >60)
GFR, EST NON AFRICAN AMERICAN: 52 mL/min — AB (ref >60)
Glucose, Bld: 179 mg/dL — ABNORMAL HIGH (ref 65–99)
POTASSIUM: 3.5 mmol/L (ref 3.5–5.1)
SODIUM: 133 mmol/L — AB (ref 135–145)
Total Bilirubin: 0.8 mg/dL (ref 0.3–1.2)
Total Protein: 7.6 g/dL (ref 6.5–8.1)

## 2016-02-06 LAB — TROPONIN I

## 2016-02-06 MED ORDER — ONDANSETRON HCL 4 MG/2ML IJ SOLN
4.0000 mg | Freq: Once | INTRAMUSCULAR | Status: AC
  Administered 2016-02-06: 4 mg via INTRAVENOUS
  Filled 2016-02-06: qty 2

## 2016-02-06 MED ORDER — SODIUM CHLORIDE 0.9 % IV BOLUS (SEPSIS)
500.0000 mL | Freq: Once | INTRAVENOUS | Status: AC
  Administered 2016-02-06: 500 mL via INTRAVENOUS

## 2016-02-06 MED ORDER — ONDANSETRON 8 MG PO TBDP: ORAL_TABLET | ORAL | Status: DC

## 2016-02-06 NOTE — ED Provider Notes (Signed)
CSN: [id number]     Arrival date & time 02/05/16  2111 History   First MD Initiated Contact with Patient 02/06/16 0023     Chief Complaint  Patient presents with  . Emesis     (Consider location/radiation/quality/duration/timing/severity/associated sxs/prior Treatment) Patient is a 60 y.o. female presenting with vomiting. The history is provided by the patient.  Emesis Severity:  Mild Duration:  3 days Timing:  Rare (dry heaves x 2 days one actual emesis last night) Quality:  Stomach contents Progression:  Unchanged Chronicity:  New Recent urination:  Normal Context: not post-tussive   Relieved by:  Nothing Worsened by:  Nothing tried Ineffective treatments:  None tried Associated symptoms: no abdominal pain, no arthralgias, no chills, no cough, no diarrhea, no fever, no headaches, no myalgias, no sore throat and no URI   Risk factors: diabetes     Past Medical History  Diagnosis Date  . Diabetes mellitus   . Hypertension   . Chest pressure, resolved 01/22/2012  . DM (diabetes mellitus) (HCC) 01/22/2012  . Hypokalemia 01/22/2012  . Dysrhythmia   . 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 History of pulmonary embolism   . Hyperlipidemia    Past Surgical History  Procedure Laterality Date  . Pacemaker insertion  08/2004    [facility] [place]  . Abdominal hysterectomy  07/2002  . Hernia repair    . Insert / replace / remove pacemaker     Family History  Problem Relation Age of Onset  . Coronary artery disease Father   . Heart attack Father   . Diabetes Sister   . Arthritis Sister   . Breast cancer Sister   . Breast cancer Mother   . Alzheimer's disease Mother   . Hypertension Mother    Social History  Substance Use Topics  . Smoking status: Never Smoker   . Smokeless tobacco: Never Used  . Alcohol Use: No   OB History    No data available     Review of Systems  Constitutional: Negative for chills.  HENT: Negative for sore  throat.   Gastrointestinal: Positive for vomiting. Negative for abdominal pain and diarrhea.  Musculoskeletal: Negative for myalgias and arthralgias.  Neurological: Negative for headaches.  All other systems reviewed and are negative.     Allergies  Review of patient's allergies indicates no known allergies.  Home Medications   Prior to Admission medications   Medication Sig Start Date End Date Taking? Authorizing Provider  amLODipine (NORVASC) 10 MG tablet Take 1 tablet (10 mg total) by mouth daily. 12/05/15   Sheliah Hatch, MD  amLODipine-atorvastatin (CADUET) 10-40 MG tablet Take 1 tablet by mouth daily. 11/07/15   Sheliah Hatch, MD  atorvastatin (LIPITOR) 40 MG tablet Take 1 tablet (40 mg total) by mouth daily. 12/05/15   Sheliah Hatch, MD  BD PEN NEEDLE NANO U/F 32G X 4 MM MISC  09/15/14   Historical Provider, MD  BYSTOLIC 10 MG tablet TAKE 1 TABLET BY MOUTH AT BEDTIME 01/16/16   Sheliah Hatch, MD  LEVEMIR FLEXTOUCH 100 UNIT/ML Pen INJECT 46 UNITS DAILY AT BED TIME. REDUCE TO 35 UNITS IF SUGAR IS LESS THAN 70 08/24/15   Historical Provider, MD  metFORMIN (GLUCOPHAGE) 500 MG tablet TAKE 1 TABLET BY MOUTH TWICE A DAY WITH A MEAL 11/10/15   Sheliah Hatch, MD  [facility] VERIO test strip Pt tests sugars twice daily. 08/01/13   Historical Provider,  MD  TANZEUM 30 MG PEN Inject 30 mg into the skin every 7 (seven) days. Take on Sunday 03/07/15   Historical Provider, MD  valsartan-hydrochlorothiazide (DIOVAN-HCT) 160-25 MG tablet TAKE 1 TABLET BY MOUTH DAILY. 01/16/16   Sheliah Hatch, MD   BP 111/86 mmHg  Pulse 98  Temp(Src) 98.1 F (36.7 C) (Oral)  Resp 16  Ht  (1.651 m)  Wt 250 lb (113.399 kg)  BMI 41.60 kg/m2  SpO2 98% Physical Exam  Constitutional: She is oriented to person, place, and time. She appears well-developed and well-nourished. No distress.  HENT:  Head: Normocephalic and atraumatic.  Mouth/Throat: Oropharynx is clear and moist.  Eyes:  Conjunctivae are normal. Pupils are equal, round, and reactive to light.  Neck: Normal range of motion. Neck supple.  Cardiovascular: Normal rate, regular rhythm and intact distal pulses.   Pulmonary/Chest: Effort normal and breath sounds normal. No respiratory distress. She has no wheezes. She has no rales.  Abdominal: Soft. Bowel sounds are normal. There is no tenderness. There is no rebound and no guarding.  Musculoskeletal: Normal range of motion.  Neurological: She is alert and oriented to person, place, and time.  Skin: Skin is warm and dry.  Psychiatric: She has a normal mood and affect.    ED Course  Procedures (including critical care time) Labs Review Labs Reviewed  URINALYSIS, ROUTINE W REFLEX MICROSCOPIC (NOT AT [facility]) - Abnormal; Notable for the following:    Color, Urine AMBER (*)    APPearance CLOUDY (*)    Bilirubin Urine SMALL (*)    Ketones, ur 15 (*)    Leukocytes, UA SMALL (*)    All other components within normal limits  URINE MICROSCOPIC-ADD ON - Abnormal; Notable for the following:    Squamous Epithelial / LPF 0-5 (*)    Bacteria, UA FEW (*)    Casts HYALINE CASTS (*)    All other components within normal limits  CBG MONITORING, ED - Abnormal; Notable for the following:    Glucose-Capillary 180 (*)    All other components within normal limits  CBC WITH DIFFERENTIAL/PLATELET  COMPREHENSIVE METABOLIC PANEL  TROPONIN I    Imaging Review No results found. I have personally reviewed and evaluated these images and lab results as part of my medical decision-making.   EKG Interpretation None      MDM   Final diagnoses:  None     EKG Interpretation  Date/Time:  Friday February 06 2016 00:57:32 EDT Ventricular Rate:  83 PR Interval:  155 QRS Duration: 83 QT Interval:  354 QTC Calculation: 416 R Axis:   8 Text Interpretation:  Sinus rhythm Left ventricular hypertrophy Confirmed by [facility]  MD, Morene Antu ([id number]) on 02/06/2016 1:16:46 AM        Results for orders placed or performed during the hospital encounter of 02/06/16  Urinalysis, Routine w reflex microscopic (not at [facility])  Result Value Ref Range   Color, Urine AMBER (A) YELLOW   APPearance CLOUDY (A) CLEAR   Specific Gravity, Urine 1.028 1.005 - 1.030   pH 5.5 5.0 - 8.0   Glucose, UA NEGATIVE NEGATIVE mg/dL   Hgb urine dipstick NEGATIVE NEGATIVE   Bilirubin Urine SMALL (A) NEGATIVE   Ketones, ur 15 (A) NEGATIVE mg/dL   Protein, ur NEGATIVE NEGATIVE mg/dL   Nitrite NEGATIVE NEGATIVE   Leukocytes, UA SMALL (A) NEGATIVE  Urine microscopic-add on  Result Value Ref Range   Squamous Epithelial / LPF 0-5 (A) NONE SEEN   WBC,  UA 0-5 0 - 5 WBC/hpf   RBC / HPF 0-5 0 - 5 RBC/hpf   Bacteria, UA FEW (A) NONE SEEN   Casts HYALINE CASTS (A) NEGATIVE  CBC with Differential/Platelet  Result Value Ref Range   WBC 7.5 4.0 - 10.5 K/uL   RBC 5.42 (H) 3.87 - 5.11 MIL/uL   Hemoglobin 14.6 12.0 - 15.0 g/dL   HCT 69.642.9 29.536.0 - 28.446.0 %   MCV 79.2 78.0 - 100.0 fL   MCH 26.9 26.0 - 34.0 pg   MCHC 34.0 30.0 - 36.0 g/dL   RDW 13.214.7 44.011.5 - 10.215.5 %   Platelets 272 150 - 400 K/uL   Neutrophils Relative % 57 %   Neutro Abs 4.3 1.7 - 7.7 K/uL   Lymphocytes Relative 35 %   Lymphs Abs 2.6 0.7 - 4.0 K/uL   Monocytes Relative 8 %   Monocytes Absolute 0.6 0.1 - 1.0 K/uL   Eosinophils Relative 0 %   Eosinophils Absolute 0.0 0.0 - 0.7 K/uL   Basophils Relative 0 %   Basophils Absolute 0.0 0.0 - 0.1 K/uL  Comprehensive metabolic panel  Result Value Ref Range   Sodium 133 (L) 135 - 145 mmol/L   Potassium 3.5 3.5 - 5.1 mmol/L   Chloride 101 101 - 111 mmol/L   CO2 22 22 - 32 mmol/L   Glucose, Bld 179 (H) 65 - 99 mg/dL   BUN 28 (H) 6 - 20 mg/dL   Creatinine, Ser 7.251.15 (H) 0.44 - 1.00 mg/dL   Calcium 9.6 8.9 - 36.610.3 mg/dL   Total Protein 7.6 6.5 - 8.1 g/dL   Albumin 4.0 3.5 - 5.0 g/dL   AST 29 15 - 41 U/L   ALT 35 14 - 54 U/L   Alkaline Phosphatase 123 38 - 126 U/L   Total Bilirubin 0.8 0.3  - 1.2 mg/dL   GFR calc non Af Amer 52 (L) >60 mL/min   GFR calc Af Amer >60 >60 mL/min   Anion gap 10 5 - 15  Troponin I  Result Value Ref Range   Troponin I <0.03 <0.031 ng/mL  POC CBG, ED  Result Value Ref Range   Glucose-Capillary 180 (H) 65 - 99 mg/dL   No results found.  Medications  ondansetron (ZOFRAN) injection 4 mg (4 mg Intravenous Given 02/06/16 0136)  sodium chloride 0.9 % bolus 500 mL (0 mLs Intravenous Stopped 02/06/16 0136)  sodium chloride 0.9 % bolus 500 mL (0 mLs Intravenous Stopped 02/06/16 0252)    Feeling markedly improved post medication.  PO challenged successfully. Suspect viral etiology of symptoms is following up with her doctor today.    Cy BlamerApril Osric Klopf, MD 02/06/16 [phone]

## 2016-02-06 NOTE — Telephone Encounter (Signed)
Pt called and advised that she was seen in the ER last night. Note stated that she has a virus that is causing her to have nausea and vomiting. Pt states that she is now having severe diarrhea and would like to know if something can be called in. Please advise?

## 2016-02-06 NOTE — Discharge Instructions (Signed)

## 2016-02-06 NOTE — Telephone Encounter (Signed)
She can take OTC Immodium to help w/ the diarrhea

## 2016-02-06 NOTE — Telephone Encounter (Signed)
Pt made aware

## 2016-02-09 ENCOUNTER — Ambulatory Visit: Payer: Managed Care, Other (non HMO) | Admitting: Family Medicine

## 2016-02-11 ENCOUNTER — Telehealth: Payer: Self-pay | Admitting: Family Medicine

## 2016-02-11 ENCOUNTER — Ambulatory Visit: Payer: Managed Care, Other (non HMO) | Admitting: Family Medicine

## 2016-02-11 NOTE — Telephone Encounter (Signed)
Patient dismissed from [facility] by Neena RhymesKatherine Tabori MD , effective February 05, 2016. Dismissal letter sent out by certified / registered mail.  DAJ  Received signed domestic return receipt verifying delivery of certified letter on February 13, 2016. Article number [id number]

## 2016-02-19 ENCOUNTER — Ambulatory Visit: Payer: Managed Care, Other (non HMO) | Admitting: Family Medicine

## 2016-03-24 ENCOUNTER — Telehealth: Payer: Self-pay | Admitting: Cardiovascular Disease

## 2016-03-25 ENCOUNTER — Encounter: Payer: Managed Care, Other (non HMO) | Admitting: Cardiovascular Disease

## 2016-03-26 ENCOUNTER — Ambulatory Visit
Admission: RE | Admit: 2016-03-26 | Discharge: 2016-03-26 | Disposition: A | Discharge status: Home / Self Care | Payer: Managed Care, Other (non HMO) | Source: Ambulatory Visit

## 2016-03-26 DIAGNOSIS — Z1231 Encounter for screening mammogram for malignant neoplasm of breast: Secondary | ICD-10-CM

## 2016-03-26 NOTE — Telephone Encounter (Signed)
Closed encounter °

## 2016-04-13 ENCOUNTER — Encounter: Payer: Managed Care, Other (non HMO) | Admitting: Cardiovascular Disease

## 2016-05-18 ENCOUNTER — Encounter: Payer: Managed Care, Other (non HMO) | Admitting: Family Medicine

## 2016-05-21 ENCOUNTER — Encounter: Payer: Managed Care, Other (non HMO) | Admitting: Family Medicine

## 2016-06-14 NOTE — Progress Notes (Signed)
Cardiology Office Note    Date:  06/22/2016   ID:  Lauren Wilkins, DOB Mar 30, 1956, MRN [id number]  PCP:  Ninetta Lights, MD  Cardiologist:   Thurmon Fair, MD   Chief Complaint  Patient presents with  . Follow-up    History of Present Illness:  Lauren Wilkins is a 60 y.o. 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  Since her last evaluation she has not had syncope. She has had palpitations during a brief period of time when she was caring for her sick brother and felt very frustrated and tired. As since resolved. She denies angina and dyspnea.  Pacemaker interrogation shows normal device function. The [employer] Jude Verity device cannot be remotely checked. As before, she has a less than 1% pacing in either the atrium or the ventricle. Estimated generator longevity is 2-6 years. Lead parameters are OK. No significant tachyarrhythmia recorded.  Recent labs show hemoglobin A1c of 10.5%. A lipid profile was ordered but not performed. She was started om [employer].  Past Medical History:  Diagnosis Date  . Cardiac pacemaker 2005   dual chamber for sinus pauses  . Chest pressure, resolved 01/22/2012  . Diabetes mellitus   . DM (diabetes mellitus) (HCC) 01/22/2012  . Dysrhythmia   . History of pulmonary embolism   . Hyperlipidemia   . Hypertension   . Hypokalemia 01/22/2012    Past Surgical History:  Procedure Laterality Date  . ABDOMINAL HYSTERECTOMY  07/2002  . HERNIA REPAIR    . INSERT / REPLACE / REMOVE PACEMAKER    . PACEMAKER INSERTION  08/2004   [employer] [facility]    Current Medications: Outpatient Medications Prior to Visit  Medication Sig Dispense Refill  . BD PEN NEEDLE NANO U/F 32G X 4 MM MISC   0  . BYSTOLIC 10 MG tablet TAKE 1 TABLET BY MOUTH AT BEDTIME 90 tablet 1  . LEVEMIR FLEXTOUCH 100 UNIT/ML Pen INJECT 46 UNITS DAILY AT BED TIME. REDUCE TO 35  UNITS IF SUGAR IS LESS THAN 70  2  . ONETOUCH VERIO test strip Pt tests sugars twice daily.    . valsartan-hydrochlorothiazide (DIOVAN-HCT) 160-25 MG tablet TAKE 1 TABLET BY MOUTH DAILY. 90 tablet 1  . amLODipine-atorvastatin (CADUET) 10-40 MG tablet Take 1 tablet by mouth daily. 90 tablet 1  . amLODipine (NORVASC) 10 MG tablet Take 1 tablet (10 mg total) by mouth daily. (Patient not taking: Reported on 06/22/2016) 30 tablet 6  . atorvastatin (LIPITOR) 40 MG tablet Take 1 tablet (40 mg total) by mouth daily. (Patient not taking: Reported on 06/22/2016) 30 tablet 6  . metFORMIN (GLUCOPHAGE) 500 MG tablet TAKE 1 TABLET BY MOUTH TWICE A DAY WITH A MEAL (Patient not taking: Reported on 06/22/2016) 180 tablet 1  . ondansetron (ZOFRAN ODT) 8 MG disintegrating tablet  ODT q8 hours prn nausea (Patient not taking: Reported on 06/22/2016) 8 tablet 0  . TANZEUM 30 MG PEN Inject 30 mg into the skin every 7 (seven) days. Take on Sunday     No facility-administered medications prior to visit.      Allergies:   Review of patient's allergies indicates no known allergies.   Social History   Social History  . Marital status: Single    Spouse name: N/A  . Number of children: N/A  . Years of education: 60   Social History Main Topics  . Smoking  status: Never Smoker  . Smokeless tobacco: Never Used  . Alcohol use No  . Drug use: No  . Sexual activity: Not Asked   Other Topics Concern  . None   Social History Narrative  . None     Family History:  The patient's family history includes Alzheimer's disease in her mother; Arthritis in her sister; Breast cancer in her mother and sister; Coronary artery disease in her father; Diabetes in her sister; Heart attack in her father; Hypertension in her mother.   ROS:   Please see the history of present illness.    ROS All other systems reviewed and are negative.   PHYSICAL EXAM:   VS:  BP 120/90 (BP Location: Right Arm, Patient Position: Sitting, Cuff Size:  Large)   Ht 5\' 6"  (1.676 m)   Wt 113.4 kg (250 lb)   BMI 40.35 kg/m    GEN: Well nourished, well developed, in no acute distress  HEENT: normal  Neck: no JVD, carotid bruits, or masses Cardiac: RRR; no murmurs, rubs, or gallops,no edema , Healthy subclavian pacemaker site Respiratory:  clear to auscultation bilaterally, normal work of breathing GI: soft, nontender, nondistended, + BS MS: no deformity or atrophy  Skin: warm and dry, no rash Neuro:  Alert and Oriented x 3, Strength and sensation are intact Psych: euthymic mood, full affect  Wt Readings from Last 3 Encounters:  06/22/16 113.4 kg (250 lb)  02/05/16 113.4 kg (250 lb)  11/07/15 113.2 kg (249 lb 8 oz)      Studies/Labs Reviewed:   EKG:  EKG is ordered today.  The ekg ordered today demonstrates Normal sinus rhythm, normal tracing  Recent Labs: 11/07/2015: TSH 2.45 02/06/2016: ALT 35; BUN 28; Creatinine, Ser 1.15; Hemoglobin 14.6; Platelets 272; Potassium 3.5; Sodium 133   Lipid Panel    Component Value Date/Time   CHOL 264 (H) 11/07/2015 1115   TRIG 167.0 (H) 11/07/2015 1115   HDL 45.10 11/07/2015 1115   CHOLHDL 6 11/07/2015 1115   VLDL 33.4 11/07/2015 1115   LDLCALC 185 (H) 11/07/2015 1115    ASSESSMENT:    1. History of syncope   2. Palpitations   3. Essential hypertension   4. Type 2 diabetes mellitus without complication, with long-term current use of insulin (HCC)   5. Morbid obesity due to excess calories (HCC)   6. History of pulmonary embolism   7. Pacemaker   8. Hyperlipidemia   9. Medication management      PLAN:  In order of problems listed above:  1. History of near syncope/possible vasovagal event: - she has virtually no need for pacing and I wonder whether her episode of "sinus arrest" that led to implantation of her pacemaker was due to a neurally mediated event. If that is the case she may still be having episodes of near syncope related to vasodepressor phenomenon. . 2. Palpitations  - No sustained arrhythmia recorded by her pacemaker. In the past these were related to hypokalemia. She now associates them with anxiety and frustration. Need to recheck potassium levels 3. HTN: - well controlled; see above 4. DM:  Poorly controlled, recently started on a new agent 5. Obesity: She has promised that she'll work hard on weight loss and suggested that she will be 50 pounds latter at an appointment 6. Remote history of pulmonary embolism, no longer on anticoagulants 7. PPM: Her device is not capable of remote monitoring. Will bring her back to device clinic in 6 months and an office  visit yearly.  8. Need to get an updated lipid profile on statin, as well as recheck potassium level    Medication Adjustments/Labs and Tests Ordered: Current medicines are reviewed at length with the patient today.  Concerns regarding medicines are outlined above.  Medication changes, Labs and Tests ordered today are listed in the Patient Instructions below. Patient Instructions  Dr Royann Shivers recommends that you continue on your current medications as directed. Please refer to the Current Medication list given to you today.  Your physician recommends that you schedule a device check appointment in 6 months with the device clinic at our [facility] office.  Dr Royann Shivers recommends that you schedule a follow-up appointment in 12 months with a pacemaker check. You will receive a reminder letter in the mail two months in advance. If you don't receive a letter, please call our office to schedule the follow-up appointment.  If you need a refill on your cardiac medications before your next appointment, please call your pharmacy.    Signed, Thurmon Fair, MD  06/22/2016 10:34 AM    [facility] [address] [place], [place], [place]  [address] Phone: [phone]; Fax: [phone]

## 2016-06-16 DIAGNOSIS — Z87898 Personal history of other specified conditions: Secondary | ICD-10-CM | POA: Insufficient documentation

## 2016-06-22 ENCOUNTER — Encounter: Payer: Self-pay | Admitting: Cardiovascular Disease

## 2016-06-22 ENCOUNTER — Ambulatory Visit (INDEPENDENT_AMBULATORY_CARE_PROVIDER_SITE_OTHER): Payer: Managed Care, Other (non HMO) | Admitting: Cardiovascular Disease

## 2016-06-22 VITALS — BP 120/90 | Ht 66.0 in | Wt 250.0 lb

## 2016-06-22 DIAGNOSIS — I10 Essential (primary) hypertension: Secondary | ICD-10-CM

## 2016-06-22 DIAGNOSIS — Z794 Long term (current) use of insulin: Secondary | ICD-10-CM

## 2016-06-22 DIAGNOSIS — E119 Type 2 diabetes mellitus without complications: Secondary | ICD-10-CM | POA: Diagnosis not present

## 2016-06-22 DIAGNOSIS — E785 Hyperlipidemia, unspecified: Secondary | ICD-10-CM

## 2016-06-22 DIAGNOSIS — Z9189 Other specified personal risk factors, not elsewhere classified: Secondary | ICD-10-CM | POA: Diagnosis not present

## 2016-06-22 DIAGNOSIS — Z950 Presence of cardiac pacemaker: Secondary | ICD-10-CM | POA: Diagnosis not present

## 2016-06-22 DIAGNOSIS — Z79899 Other long term (current) drug therapy: Secondary | ICD-10-CM

## 2016-06-22 DIAGNOSIS — R002 Palpitations: Secondary | ICD-10-CM

## 2016-06-22 DIAGNOSIS — Z86711 Personal history of pulmonary embolism: Secondary | ICD-10-CM

## 2016-06-22 DIAGNOSIS — Z87898 Personal history of other specified conditions: Secondary | ICD-10-CM

## 2016-06-22 LAB — CUP PACEART INCLINIC DEVICE CHECK
Battery Voltage: 2.79 V
Implantable Lead Implant Date: 20051013
Implantable Lead Location: 753859
Lead Channel Impedance Value: 453 Ohm
Lead Channel Pacing Threshold Pulse Width: 0.6 ms
Lead Channel Setting Pacing Amplitude: 2 V
Lead Channel Setting Pacing Amplitude: 3 V
Lead Channel Setting Pacing Pulse Width: 0.6 ms
MDC IDC LEAD IMPLANT DT: 20051013
MDC IDC LEAD LOCATION: 753860
MDC IDC MSMT BATTERY IMPEDANCE: 2300 Ohm
MDC IDC MSMT LEADCHNL RA PACING THRESHOLD AMPLITUDE: 0.75 V
MDC IDC MSMT LEADCHNL RA PACING THRESHOLD PULSEWIDTH: 0.4 ms
MDC IDC MSMT LEADCHNL RA SENSING INTR AMPL: 2.2 mV
MDC IDC MSMT LEADCHNL RV IMPEDANCE VALUE: 453 Ohm
MDC IDC MSMT LEADCHNL RV PACING THRESHOLD AMPLITUDE: 1.5 V
MDC IDC MSMT LEADCHNL RV SENSING INTR AMPL: 8.5 mV
MDC IDC PG SERIAL: 1357232
MDC IDC SESS DTM: 20170801132216
MDC IDC SET LEADCHNL RV SENSING SENSITIVITY: 2 mV
Pulse Gen Model: 5356

## 2016-06-22 NOTE — Patient Instructions (Addendum)
Dr Royann Shivers recommends that you continue on your current medications as directed. Please refer to the Current Medication list given to you today.  Your physician recommends that you schedule a device check appointment in 6 months with the device clinic at our [facility] office.  Dr Royann Shivers recommends that you schedule a follow-up appointment in 12 months with a pacemaker check. You will receive a reminder letter in the mail two months in advance. If you don't receive a letter, please call our office to schedule the follow-up appointment.  If you need a refill on your cardiac medications before your next appointment, please call your pharmacy.

## 2016-06-23 ENCOUNTER — Encounter: Payer: Self-pay | Admitting: Cardiovascular Disease

## 2016-09-17 ENCOUNTER — Encounter: Payer: Self-pay | Admitting: Internal Medicine

## 2016-09-17 NOTE — Telephone Encounter (Signed)
error:[id number] ° °

## 2017-01-28 ENCOUNTER — Encounter: Payer: Self-pay | Admitting: Cardiovascular Disease

## 2017-03-28 ENCOUNTER — Other Ambulatory Visit: Payer: Self-pay | Admitting: Internal Medicine

## 2017-03-28 DIAGNOSIS — Z1231 Encounter for screening mammogram for malignant neoplasm of breast: Secondary | ICD-10-CM

## 2017-04-13 ENCOUNTER — Ambulatory Visit: Payer: Managed Care, Other (non HMO)

## 2017-05-31 ENCOUNTER — Encounter: Payer: Self-pay | Admitting: Cardiovascular Disease

## 2017-05-31 ENCOUNTER — Telehealth: Payer: Self-pay | Admitting: Cardiovascular Disease

## 2017-05-31 NOTE — Telephone Encounter (Signed)
Closed encounter °

## 2017-06-01 ENCOUNTER — Ambulatory Visit: Payer: Managed Care, Other (non HMO)

## 2017-07-18 ENCOUNTER — Ambulatory Visit: Payer: Managed Care, Other (non HMO)

## 2017-07-19 ENCOUNTER — Encounter: Payer: Self-pay | Admitting: Cardiovascular Disease

## 2017-07-20 ENCOUNTER — Ambulatory Visit
Admission: RE | Admit: 2017-07-20 | Discharge: 2017-07-20 | Disposition: A | Discharge status: Home / Self Care | Payer: 59 | Source: Ambulatory Visit | Attending: Internal Medicine | Admitting: Internal Medicine

## 2017-07-20 DIAGNOSIS — Z1231 Encounter for screening mammogram for malignant neoplasm of breast: Secondary | ICD-10-CM

## 2018-02-15 ENCOUNTER — Ambulatory Visit (INDEPENDENT_AMBULATORY_CARE_PROVIDER_SITE_OTHER): Payer: 59 | Admitting: Cardiovascular Disease

## 2018-02-15 ENCOUNTER — Encounter: Payer: Self-pay | Admitting: Cardiovascular Disease

## 2018-02-15 VITALS — BP 132/70 | HR 94 | Ht 65.0 in | Wt 241.0 lb

## 2018-02-15 DIAGNOSIS — E1169 Type 2 diabetes mellitus with other specified complication: Secondary | ICD-10-CM | POA: Diagnosis not present

## 2018-02-15 DIAGNOSIS — Z950 Presence of cardiac pacemaker: Secondary | ICD-10-CM

## 2018-02-15 DIAGNOSIS — I455 Other specified heart block: Secondary | ICD-10-CM

## 2018-02-15 DIAGNOSIS — Z86711 Personal history of pulmonary embolism: Secondary | ICD-10-CM

## 2018-02-15 DIAGNOSIS — I10 Essential (primary) hypertension: Secondary | ICD-10-CM | POA: Diagnosis not present

## 2018-02-15 DIAGNOSIS — R55 Syncope and collapse: Secondary | ICD-10-CM | POA: Diagnosis not present

## 2018-02-15 DIAGNOSIS — E669 Obesity, unspecified: Secondary | ICD-10-CM | POA: Diagnosis not present

## 2018-02-15 DIAGNOSIS — E782 Mixed hyperlipidemia: Secondary | ICD-10-CM

## 2018-02-15 NOTE — Patient Instructions (Signed)
Dr Royann Shiversroitoru recommends that you continue on your current medications as directed. Please refer to the Current Medication list given to you today.  Your physician recommends that you schedule an appointment in 6 months in our [facility] for a pacemaker check.  Dr Royann Shiversroitoru recommends that you schedule a follow-up appointment in 12 months with a pacemaker check. You will receive a reminder letter in the mail two months in advance. If you don't receive a letter, please call our office to schedule the follow-up appointment.  If you need a refill on your cardiac medications before your next appointment, please call your pharmacy.

## 2018-02-15 NOTE — Progress Notes (Signed)
Cardiology Office Note    Date:  02/17/2018   ID:  Lauren Wilkins Sooy, DOB Apr 16, 1956, MRN [id number]  PCP:  Laqueta DueFurr, Sara M., MD  Cardiologist:   Thurmon FairMihai Tristine Langi, MD   Chief Complaint  Patient presents with  . Pacemaker Check    History of Present Illness:  Lauren Wilkins Kievit is a 62 y.o. 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  Since her last evaluation she has not had syncope.  She has been through a rough patch emotionally after her brother died unexpectedly.  He was found floating in the pool but did not died of drowning.  Exact cause of death is uncertain.  DM and lipid levels are poorly controlled, but "she is now doing better".  The patient specifically denies any chest pain at rest exertion, dyspnea at rest or with exertion, orthopnea, paroxysmal nocturnal dyspnea, syncope, palpitations, focal neurological deficits, intermittent claudication, lower extremity edema, unexplained weight gain, cough, hemoptysis or wheezing.   Pacemaker interrogation shows normal device function. The [employer]. Jude Verity device cannot be remotely checked. She has <1% pacing in either the atrium or the ventricle. Estimated generator longevity is 1.75-5 years. Lead parameters are OK. No significant tachyarrhythmia recorded.  Past Medical History:  Diagnosis Date  . Cardiac pacemaker 2005   dual chamber for sinus pauses  . Chest pressure, resolved 01/22/2012  . Diabetes mellitus   . DM (diabetes mellitus) (HCC) 01/22/2012  . Dysrhythmia   . History of pulmonary embolism   . Hyperlipidemia   . Hypertension   . Hypokalemia 01/22/2012    Past Surgical History:  Procedure Laterality Date  . ABDOMINAL HYSTERECTOMY  07/2002  . HERNIA REPAIR    . INSERT / REPLACE / REMOVE PACEMAKER    . PACEMAKER INSERTION  08/2004   [employer] XLDR    Current Medications: Outpatient Medications  Prior to Visit  Medication Sig Dispense Refill  . amLODipine (NORVASC) 10 MG tablet Take 1 tablet by mouth daily.    Marland Kitchen. atorvastatin (LIPITOR) 40 MG tablet Take 1 tablet by mouth daily.    . BD PEN NEEDLE NANO U/F 32G X 4 MM MISC   0  . carvedilol (COREG) 6.25 MG tablet Take 6.25 mg by mouth 2 (two) times daily.  6  . ezetimibe (ZETIA) 10 MG tablet Take by mouth.    Marland Kitchen. JARDIANCE 25 MG TABS tablet Take 25 mg by mouth daily.  1  . LEVEMIR FLEXTOUCH 100 UNIT/ML Pen INJECT 46 UNITS DAILY AT BED TIME. REDUCE TO 35 UNITS IF SUGAR IS LESS THAN 70  2  . metFORMIN (GLUCOPHAGE-XR) 500 MG 24 hr tablet Take 2 tablets by mouth 2 (two) times daily.    Letta Pate. ONETOUCH VERIO test strip Pt tests sugars twice daily.    Marland Kitchen. BYSTOLIC 10 MG tablet TAKE 1 TABLET BY MOUTH AT BEDTIME (Patient not taking: Reported on 02/15/2018) 90 tablet 1  . INVOKANA 100 MG TABS tablet Take 1 tablet by mouth daily.    . valsartan-hydrochlorothiazide (DIOVAN-HCT) 160-25 MG tablet TAKE 1 TABLET BY MOUTH DAILY. (Patient not taking: Reported on 02/15/2018) 90 tablet 1   No facility-administered medications prior to visit.      Allergies:   Patient has no known allergies.   Social History   Socioeconomic History  . Marital status: Single    Spouse name: Not on file  . Number of children:  Not on file  . Years of education: 30  . Highest education level: Not on file  Occupational History  . Not on file  Social Needs  . Financial resource strain: Not on file  . Food insecurity:    Worry: Not on file    Inability: Not on file  . Transportation needs:    Medical: Not on file    Non-medical: Not on file  Tobacco Use  . Smoking status: Never Smoker  . Smokeless tobacco: Never Used  Substance and Sexual Activity  . Alcohol use: No  . Drug use: No  . Sexual activity: Not on file  Lifestyle  . Physical activity:    Days per week: Not on file    Minutes per session: Not on file  . Stress: Not on file  Relationships  . Social  connections:    Talks on phone: Not on file    Gets together: Not on file    Attends religious service: Not on file    Active member of club or organization: Not on file    Attends meetings of clubs or organizations: Not on file    Relationship status: Not on file  Other Topics Concern  . Not on file  Social History Narrative  . Not on file     Family History:  The patient's family history includes Alzheimer's disease in her mother; Arthritis in her sister; Breast cancer in her mother and sister; Coronary artery disease in her father; Diabetes in her sister; Heart attack in her father; Hypertension in her mother.   ROS:   Please see the history of present illness.    ROS All other systems reviewed and are negative.   PHYSICAL EXAM:   VS:  BP 132/70   Pulse 94   Ht 5\' 5"  (1.651 m)   Wt 241 lb (109.3 kg)   BMI 40.10 kg/m     General: Alert, oriented x3, no distress, morbidly obese. Head: no evidence of trauma, PERRL, EOMI, no exophtalmos or lid lag, no myxedema, no xanthelasma; normal ears, nose and oropharynx Neck: normal jugular venous pulsations and no hepatojugular reflux; brisk carotid pulses without delay and no carotid bruits Chest: clear to auscultation, no signs of consolidation by percussion or palpation, normal fremitus, symmetrical and full respiratory excursions Cardiovascular: normal position and quality of the apical impulse, regular rhythm, normal first and second heart sounds, no murmurs, rubs or gallops Abdomen: no tenderness or distention, no masses by palpation, no abnormal pulsatility or arterial bruits, normal bowel sounds, no hepatosplenomegaly Extremities: no clubbing, cyanosis or edema; 2+ radial, ulnar and brachial pulses bilaterally; 2+ right femoral, posterior tibial and dorsalis pedis pulses; 2+ left femoral, posterior tibial and dorsalis pedis pulses; no subclavian or femoral bruits Neurological: grossly nonfocal Psych: Normal mood and affect   Wt  Readings from Last 3 Encounters:  02/15/18 241 lb (109.3 kg)  06/22/16 250 lb (113.4 kg)  02/05/16 250 lb (113.4 kg)      Studies/Labs Reviewed:   EKG:  EKG is ordered today.  The ekg ordered today demonstrates Normal sinus rhythm, occasional PVCs, poor R wave progression  Recent Labs: Primary care provider November 28, 2017 Total cholesterol 299, LDL 220, HDL 53, triglycerides 215 Hemoglobin A1c 10.9% January 11, 2018 Creatinine 1.04, potassium 4.3   Lipid Panel    Component Value Date/Time   CHOL 264 (H) 11/07/2015 1115   TRIG 167.0 (H) 11/07/2015 1115   HDL 45.10 11/07/2015 1115   CHOLHDL 6  11/07/2015 1115   VLDL 33.4 11/07/2015 1115   LDLCALC 185 (H) 11/07/2015 1115    ASSESSMENT:    1. Vasovagal syncope   2. Essential hypertension   3. Diabetes mellitus type 2 in obese (HCC)   4. Morbid obesity (HCC)   5. History of pulmonary embolism   6. Pacemaker   7. Mixed hyperlipidemia      PLAN:  In order of problems listed above:  1. History of near syncope: Vasovagal event with cardioinhibitory component interpreted as "sinus arrest", no recurrence since pacemaker was implanted. 2. HTN: good control 3. DM:  Poorly controlled, recently started on a new agent 4. Obesity: only 9 lb weight loss in last 18 months, less than her goal. 5. Remote history of pulmonary embolism, no longer on anticoagulants 6. PPM: Her device is not capable of remote monitoring. She has not had her device checked in over 18 months. Reinforced need for Q6 month check. 7. HLP: target LDL<100. Recheck on statin.    Medication Adjustments/Labs and Tests Ordered: Current medicines are reviewed at length with the patient today.  Concerns regarding medicines are outlined above.  Medication changes, Labs and Tests ordered today are listed in the Patient Instructions below. Patient Instructions  Dr Royann Shivers recommends that you continue on your current medications as directed. Please refer to the  Current Medication list given to you today.  Your physician recommends that you schedule an appointment in 6 months in our [facility] device clinic for a pacemaker check.  Dr Royann Shivers recommends that you schedule a follow-up appointment in 12 months with a pacemaker check. You will receive a reminder letter in the mail two months in advance. If you don't receive a letter, please call our office to schedule the follow-up appointment.  If you need a refill on your cardiac medications before your next appointment, please call your pharmacy.    Signed, Thurmon Fair, MD  02/17/2018 12:54 PM    [facility] [address] [place], [place], [place]  [address] Phone: [phone]; Fax: [phone]

## 2018-02-16 ENCOUNTER — Telehealth: Payer: Self-pay | Admitting: *Deleted

## 2018-02-16 NOTE — Telephone Encounter (Signed)
Pt called c/o vaginal externally itching x 1 week, pt last seen Dr.Lavoie on 02/2016. I recommend patient try Monistat externally over the weekend and follow-up with OV for problem next week and schedule annual exam as well.

## 2018-02-17 ENCOUNTER — Encounter: Payer: Self-pay | Admitting: Cardiovascular Disease

## 2018-02-17 DIAGNOSIS — R55 Syncope and collapse: Secondary | ICD-10-CM | POA: Insufficient documentation

## 2018-02-17 DIAGNOSIS — E669 Obesity, unspecified: Secondary | ICD-10-CM

## 2018-02-17 DIAGNOSIS — E1169 Type 2 diabetes mellitus with other specified complication: Secondary | ICD-10-CM | POA: Insufficient documentation

## 2018-02-22 ENCOUNTER — Encounter: Payer: Self-pay | Admitting: Obstetrics & Gynecology

## 2018-03-24 ENCOUNTER — Other Ambulatory Visit: Payer: Self-pay

## 2018-03-24 ENCOUNTER — Encounter (HOSPITAL_BASED_OUTPATIENT_CLINIC_OR_DEPARTMENT_OTHER): Payer: Self-pay

## 2018-03-24 ENCOUNTER — Emergency Department (HOSPITAL_BASED_OUTPATIENT_CLINIC_OR_DEPARTMENT_OTHER): Payer: 59

## 2018-03-24 ENCOUNTER — Observation Stay (HOSPITAL_BASED_OUTPATIENT_CLINIC_OR_DEPARTMENT_OTHER)
Admission: EM | Admit: 2018-03-24 | Discharge: 2018-03-24 | Disposition: A | Discharge status: Home / Self Care | Payer: 59 | Attending: Internal Medicine | Admitting: Internal Medicine

## 2018-03-24 DIAGNOSIS — Z950 Presence of cardiac pacemaker: Secondary | ICD-10-CM | POA: Insufficient documentation

## 2018-03-24 DIAGNOSIS — R072 Precordial pain: Secondary | ICD-10-CM | POA: Diagnosis not present

## 2018-03-24 DIAGNOSIS — I10 Essential (primary) hypertension: Secondary | ICD-10-CM | POA: Diagnosis not present

## 2018-03-24 DIAGNOSIS — R61 Generalized hyperhidrosis: Secondary | ICD-10-CM | POA: Diagnosis not present

## 2018-03-24 DIAGNOSIS — E119 Type 2 diabetes mellitus without complications: Secondary | ICD-10-CM | POA: Insufficient documentation

## 2018-03-24 DIAGNOSIS — Z6839 Body mass index (BMI) 39.0-39.9, adult: Secondary | ICD-10-CM | POA: Insufficient documentation

## 2018-03-24 DIAGNOSIS — E7800 Pure hypercholesterolemia, unspecified: Secondary | ICD-10-CM | POA: Insufficient documentation

## 2018-03-24 DIAGNOSIS — R079 Chest pain, unspecified: Secondary | ICD-10-CM | POA: Diagnosis present

## 2018-03-24 DIAGNOSIS — R110 Nausea: Secondary | ICD-10-CM | POA: Diagnosis not present

## 2018-03-24 DIAGNOSIS — Z86711 Personal history of pulmonary embolism: Secondary | ICD-10-CM | POA: Diagnosis not present

## 2018-03-24 DIAGNOSIS — I493 Ventricular premature depolarization: Secondary | ICD-10-CM | POA: Diagnosis not present

## 2018-03-24 LAB — TROPONIN I: Troponin I: <0.03 ng/mL (ref <0.03)

## 2018-03-24 LAB — CBC WITH DIFFERENTIAL/PLATELET
Basophils Absolute: 0 10*3/uL (ref 0.0–0.1)
Basophils Relative: 0 %
EOS ABS: 0.1 10*3/uL (ref 0.0–0.7)
Eosinophils Relative: 1 %
HEMATOCRIT: 42.1 % (ref 36.0–46.0)
HEMOGLOBIN: 14.8 g/dL (ref 12.0–15.0)
LYMPHS ABS: 2.8 10*3/uL (ref 0.7–4.0)
LYMPHS PCT: 40 %
MCH: 28.3 pg (ref 26.0–34.0)
MCHC: 35.2 g/dL (ref 30.0–36.0)
MCV: 80.5 fL (ref 78.0–100.0)
MONOS PCT: 7 %
Monocytes Absolute: 0.5 10*3/uL (ref 0.1–1.0)
NEUTROS ABS: 3.6 10*3/uL (ref 1.7–7.7)
NEUTROS PCT: 52 %
Platelets: 256 10*3/uL (ref 150–400)
RBC: 5.23 MIL/uL — ABNORMAL HIGH (ref 3.87–5.11)
RDW: 14.9 % (ref 11.5–15.5)
WBC: 6.9 10*3/uL (ref 4.0–10.5)

## 2018-03-24 LAB — BASIC METABOLIC PANEL
ANION GAP: 9 (ref 5–15)
BUN: 19 mg/dL (ref 6–20)
CO2: 23 mmol/L (ref 22–32)
Calcium: 10.2 mg/dL (ref 8.9–10.3)
Chloride: 107 mmol/L (ref 101–111)
Creatinine, Ser: 0.83 mg/dL (ref 0.44–1.00)
GFR calc non Af Amer: >60 mL/min (ref >60)
Glucose, Bld: 184 mg/dL — ABNORMAL HIGH (ref 65–99)
Potassium: 3.8 mmol/L (ref 3.5–5.1)
Sodium: 139 mmol/L (ref 135–145)

## 2018-03-24 MED ORDER — ASPIRIN 81 MG PO CHEW
324.0000 mg | CHEWABLE_TABLET | Freq: Once | ORAL | Status: AC
  Administered 2018-03-24: 324 mg via ORAL
  Filled 2018-03-24: qty 4

## 2018-03-24 NOTE — ED Provider Notes (Signed)
Emergency Department Provider Note   I have reviewed the triage vital signs and the nursing notes.   HISTORY  Chief Complaint Chest Pain   HPI Lauren Wilkins is a 62 y.o. female with PMH of HLD, DM, HTN, and SSS with pacemaker presents to the emergency department for evaluation of intermittent chest pain with diaphoresis and nausea.  The patient had some symptoms last night but was able to get to sleep.  She woke in the night with sweating and shortly after developed a central to lower sternal chest heaviness associated nausea.  She did not have vomiting. She initially thought the diaphoresis was related to "hot flashes" but then CP began which was unusual. No radiation of symptoms or modifying factors.  She has not had similar pain in the past.  She does follow with cardiology for syncope and had a on-demand pacemaker placed [facility]).  She denies any fevers or chills.  No vomiting or diarrhea.  Active chest pain at this time.   Past Medical History:  Diagnosis Date  . Cardiac pacemaker 2005   dual chamber for sinus pauses  . Chest pressure, resolved 01/22/2012  . Diabetes mellitus   . DM (diabetes mellitus) (HCC) 01/22/2012  . Dysrhythmia   . History of pulmonary embolism   . Hyperlipidemia   . Hypertension   . Hypokalemia 01/22/2012    Patient Active Problem List   Diagnosis Date Noted  . Chest pain 03/24/2018  . Diabetes mellitus type 2 in obese (HCC) 02/17/2018  . Vasovagal syncope 02/17/2018  . History of syncope 06/16/2016  . Hypercholesterolemia 11/07/2015  . Vitamin D deficiency 04/16/2015  . Frequent urination 11/05/2014  . Fatigue 11/05/2014  . Abnormal urine odor 08/29/2014  . Routine general medical examination at a health care facility 04/22/2014  . Morbid obesity (HCC) 04/22/2014  . Other and unspecified hyperlipidemia 10/22/2013  . Pacemaker 04/24/2013  . History of pulmonary embolism 04/24/2013  . Sinus arrest 04/24/2013  . HTN (hypertension) 04/24/2013    . Chest pressure, resolved 01/22/2012  . DM (diabetes mellitus) (HCC) 01/22/2012  . Hypokalemia 01/22/2012    Past Surgical History:  Procedure Laterality Date  . ABDOMINAL HYSTERECTOMY  07/2002  . HERNIA REPAIR    . INSERT / REPLACE / REMOVE PACEMAKER    . PACEMAKER INSERTION  08/2004   [facility] [facility]    Current Outpatient Rx  . Order #: [id number] Class: Historical Med  . Order #: [id number] Class: Historical Med  . Order #: [id number] Class: Historical Med  . Order #: [id number] Class: Historical Med  . Order #: [id number] Class: Historical Med  . Order #: [id number] Class: Historical Med  . Order #: [id number] Class: Historical Med  . Order #: [id number] Class: Historical Med  . Order #: [id number] Class: Historical Med    Allergies Patient has no known allergies.  Family History  Problem Relation Age of Onset  . Coronary artery disease Father   . Heart attack Father   . Breast cancer Mother   . Alzheimer's disease Mother   . Hypertension Mother   . Diabetes Sister   . Arthritis Sister   . Breast cancer Sister     Social History Social History   Tobacco Use  . Smoking status: Never Smoker  . Smokeless tobacco: Never Used  Substance Use Topics  . Alcohol use: No  . Drug use: No    Review of Systems  Constitutional: No fever/chills Eyes: No visual changes. ENT: No sore throat. Cardiovascular: Positive chest pain and diaphoresis.  Respiratory: Denies shortness of breath. Gastrointestinal: No abdominal pain. Positive nausea, no vomiting.  No diarrhea.  No constipation. Genitourinary: Negative for dysuria. Musculoskeletal: Negative for back pain. Skin: Negative for rash. Neurological: Negative for headaches, focal weakness or numbness.  10-point ROS otherwise negative.  ____________________________________________   PHYSICAL EXAM:  VITAL SIGNS: ED Triage Vitals  Enc Vitals Group     BP 03/24/18 1252 (!) 155/11     Pulse Rate 03/24/18 1252 (!) 102      Resp 03/24/18 1252 18     Temp 03/24/18 1252 98.6 F (37 C)     Temp Source 03/24/18 1252 Oral     SpO2 03/24/18 1252 98 %     Weight 03/24/18 1253 240 lb (108.9 kg)     Height 03/24/18 1253  (1.651 m)     Pain Score 03/24/18 1253 0   Constitutional: Alert and oriented. Well appearing and in no acute distress. Eyes: Conjunctivae are normal.  Head: Atraumatic. Nose: No congestion/rhinnorhea. Mouth/Throat: Mucous membranes are moist.   Neck: No stridor.   Cardiovascular: Tachycardia. Good peripheral circulation. Grossly normal heart sounds.   Respiratory: Normal respiratory effort.  No retractions. Lungs CTAB. Gastrointestinal: Soft and nontender. No distention.  Musculoskeletal: No lower extremity tenderness nor edema. No gross deformities of extremities. Neurologic:  Normal speech and language. No gross focal neurologic deficits are appreciated.  Skin:  Skin is warm, dry and intact. No rash noted.  ____________________________________________   LABS (all labs ordered are listed, but only abnormal results are displayed)  Labs Reviewed  BASIC METABOLIC PANEL - Abnormal; Notable for the following components:      Result Value   Glucose, Bld 184 (*)    All other components within normal limits  CBC WITH DIFFERENTIAL/PLATELET - Abnormal; Notable for the following components:   RBC 5.23 (*)    All other components within normal limits  TROPONIN I   ____________________________________________  EKG   EKG Interpretation  Date/Time:  Friday Mar 24 2018 12:52:13 EDT Ventricular Rate:  100 PR Interval:  160 QRS Duration: 80 QT Interval:  334 QTC Calculation: 430 R Axis:   -31 Text Interpretation:  Sinus rhythm with frequent Premature ventricular complexes Left axis deviation Minimal voltage criteria for LVH, may be normal variant Possible Anterior infarct , age undetermined Abnormal ECG No STEMI. Frequent PVCs.  Confirmed by Alona Bene [phone]) on 03/24/2018 12:57:01  PM       ____________________________________________  RADIOLOGY  Dg Chest 2 View  Result Date: 03/24/2018 CLINICAL DATA:  Chest pain.  Nausea P EXAM: CHEST - 2 VIEW COMPARISON:  04/21/2015. FINDINGS: Cardiac pacer with lead tip over the right atrium and right ventricle. Heart size stable. Normal pulmonary vascularity. No focal infiltrate. No pleural effusion or pneumothorax. No acute bony abnormality. IMPRESSION: 1. Cardiac pacer with lead tips over the right atrium right ventricle. Heart size stable. 2.  No acute pulmonary disease. Electronically Signed   By: Maisie Fus  Register   On: 03/24/2018 14:22    ____________________________________________   PROCEDURES  Procedure(s) performed:   Procedures  None ____________________________________________   INITIAL IMPRESSION / ASSESSMENT AND PLAN / ED COURSE  Pertinent labs & imaging results that were available during my care of the patient were reviewed by me and considered in my medical decision making (see chart for details).  Patient presents to the emergency department for evaluation of central chest pressure with nausea and diaphoresis.  She has had 2 episodes in the last 12 hours.  No active pain at this time.  EKG is significant only for frequent PVCs and non=speecific ST changes.  Does not appear to be pacing at this time.  Will obtain and interrogation.  Patient has significant cardiovascular risk factors.  Heart score is 5.  Plan for labs, chest x-ray, likely admission for cardiac evaluation.  Labs reviewed with no acute findings. Patient remains CP free. Discussed case with Dr. Dartha Lodge   Discussed patient's case with Hospitalist, Dr. Dartha Lodge to request admission. Patient and family (if present) updated with plan. Care transferred to Hospitalist service.  I reviewed all nursing notes, vitals, pertinent old records, EKGs, labs, imaging (as available).  03:30 PM Called to the patient's bedside.  She is requesting to be  discharged at this time.  We again discussed the patient's symptoms and risk factors.  I discussed that even though her lab work and EKG are reassuring at this time that could change on repeat testing.  I discussed my concern for heart attack warning signs and unstable angina.  Patient is insistent that she is feeling much better and would like to go home to monitor her symptoms.  I discussed that she will have to sign out AGAINST MEDICAL ADVICE.  She understands that by leaving now we could be delaying or missing the diagnosis of acute myocardial infarction which could lead to significant disability and even death.  Patient has capacity on my assessment to make this decision.  She will call her cardiologist on Monday.  I discussed that she is free to return to this or any other emergency department at any time for additional evaluation.  I encouraged her to call 911 of her chest pain returns. She verbalizes understanding.  ____________________________________________  FINAL CLINICAL IMPRESSION(S) / ED DIAGNOSES  Final diagnoses:  Precordial chest pain     MEDICATIONS GIVEN DURING THIS VISIT:  Medications  aspirin chewable tablet 324 mg (324 mg Oral Given 03/24/18 1509)    Note:  This document was prepared using Dragon voice recognition software and may include unintentional dictation errors.  Alona Bene, MD Emergency Medicine    Darius Lundberg, Arlyss Repress, MD 03/24/18 [phone]

## 2018-03-24 NOTE — Discharge Instructions (Signed)
You were seen in the ED today with chest pain. We advised admission for more heart evaluation but you decided to leave Against Medical Advice. Please monitor your symptoms closely at home and return or call 911 if you develop any additional chest pain, difficulty breathing, or other concerning symptoms. Call your Cardiologist first thing Monday morning if we do not see you back in the Emergency Department before then.

## 2018-03-24 NOTE — ED Triage Notes (Signed)
Pt c/o center chest pain since last night at work, states woke up with chest pain again; denies SOB, states some nausea

## 2018-03-24 NOTE — ED Notes (Signed)
Pt. Reports she started having chest pain yesterday and last night.  No pain today.  Pt. Reports she also had some nausea with the chest pain.  Pt. In no distress in ED today.

## 2018-04-03 ENCOUNTER — Other Ambulatory Visit: Payer: Self-pay | Admitting: Obstetrics & Gynecology

## 2018-04-03 ENCOUNTER — Encounter: Payer: Self-pay | Admitting: Obstetrics & Gynecology

## 2018-04-03 ENCOUNTER — Telehealth: Payer: Self-pay

## 2018-04-03 ENCOUNTER — Ambulatory Visit (INDEPENDENT_AMBULATORY_CARE_PROVIDER_SITE_OTHER): Payer: 59 | Admitting: Obstetrics & Gynecology

## 2018-04-03 VITALS — BP 132/86 | Ht 65.5 in | Wt 241.0 lb

## 2018-04-03 DIAGNOSIS — N904 Leukoplakia of vulva: Secondary | ICD-10-CM | POA: Diagnosis not present

## 2018-04-03 DIAGNOSIS — R300 Dysuria: Secondary | ICD-10-CM

## 2018-04-03 DIAGNOSIS — L292 Pruritus vulvae: Secondary | ICD-10-CM | POA: Diagnosis not present

## 2018-04-03 LAB — WET PREP FOR TRICH, YEAST, CLUE

## 2018-04-03 MED ORDER — CLOBETASOL PROPIONATE 0.05 % EX OINT: 1.0000 "application " | TOPICAL_OINTMENT | Freq: Two times a day (BID) | CUTANEOUS | 3 refills | Status: DC

## 2018-04-03 NOTE — Telephone Encounter (Signed)
Was in at 2pm and said Dr. Mackey Birchwood to send Rx but she is at pharmacy and no Rx.  Upon review it appears the Rx defaulted to print instead of being sent electronically. I called it in to her pharmacy and called her and let her know this and apologized for her inconvenience. She was very understanding.

## 2018-04-03 NOTE — Progress Notes (Signed)
    Lauren Wilkins Jan 31, 1956 [id number]        62 y.o.  G0  RP: Vulvar irritation and itching  HPI: S/P Hysterectomy.  Menopause on no HRT.  Burning at the vulva after passing urine.  Worsening vulvar irritation and itching over the past months.  No increase in vaginal discharge.  No pelvic pain.  Currently abstinent.   OB History  Gravida Para Term Preterm AB Living  0 0 0 0 0 0  SAB TAB Ectopic Multiple Live Births  0 0 0 0 0    Past medical history,surgical history, problem list, medications, allergies, family history and social history were all reviewed and documented in the EPIC chart.   Directed ROS with pertinent positives and negatives documented in the history of present illness/assessment and plan.  Exam:  Vitals:   04/03/18 1403  BP: 132/86  Weight: 241 lb (109.3 kg)  Height: 5' 5.5" (1.664 m)   General appearance:  Normal  Gynecologic exam:   Physical Exam  Genitourinary:       U/A: Yellow clear, Nitrites negative, WBC 10-20, RBC 0-2, Bacteria few  Wet prep: Negative   Assessment/Plan:  62 y.o. G0P0000   1. Vulvar itching Wet prep negative, no yeast or BV.  Symptoms and findings on exam c/w Lichen Sclerosus. - WET PREP FOR TRICH, YEAST, CLUE  2. Lichen sclerosus et atrophicus of the vulva Typical Lichen Sclerosus and atrophicus of the vulva.  Diagnosis and management discussed with patient.  Will start with Clobetasol 0.05% ointment, apply a thin on the affected vulva and perianal area twice a day x 2 weeks, then daily x 2 weeks, then every other day.  F/U for reevaluation at the time of your Annual/Gyn visit.  Counseling done and prescription sent to pharmacy.  Urine culture pending, will wait on result.  Other orders - clobetasol ointment (TEMOVATE) 0.05 %; Apply 1 application topically 2 (two) times daily. Thin vulvar/perianal application  Counseling on above issues and coordination of care >50% x 30 minutes  Genia Del MD, 2:13 PM  04/03/2018

## 2018-04-04 LAB — URINE CULTURE
MICRO NUMBER:: 90581250
Result:: NO GROWTH
SPECIMEN QUALITY:: ADEQUATE

## 2018-04-05 ENCOUNTER — Encounter: Payer: Self-pay | Admitting: Obstetrics & Gynecology

## 2018-04-05 NOTE — Patient Instructions (Signed)
1. Vulvar itching Wet prep negative, no yeast or BV.  Symptoms and findings on exam c/w Lichen Sclerosus. - WET PREP FOR TRICH, YEAST, CLUE  2. Lichen sclerosus et atrophicus of the vulva Typical Lichen Sclerosus and atrophicus of the vulva.  Diagnosis and management discussed with patient.  Will start with Clobetasol 0.05% ointment, apply a thin on the affected vulva and perianal area twice a day x 2 weeks, then daily x 2 weeks, then every other day.  F/U for reevaluation at the time of your Annual/Gyn visit.  Counseling done and prescription sent to pharmacy.  Urine culture pending, will wait on result.  Other orders - clobetasol ointment (TEMOVATE) 0.05 %; Apply 1 application topically 2 (two) times daily. Thin vulvar/perianal application  Lauren Wilkins, it was a pleasure seeing you today!   Lichen Sclerosus Lichen sclerosus is a skin problem. It can happen on any part of the body, but it commonly involves the anal or genital areas. It can cause itching and discomfort in these areas. Treatment can help to control symptoms. When the genital area is affected, getting treatment is important because the condition can cause scarring that may lead to other problems. What are the causes? The cause of this condition is not known. It could be the result of an overactive immune system or a lack of certain hormones. Lichen sclerosus is not an infection or a fungus. It is not passed from one person to another (not contagious). What increases the risk? This condition is more likely to develop in women, usually after menopause. What are the signs or symptoms? Symptoms of this condition include:  Thin, wrinkled, white areas on the skin.  Thickened white areas on the skin.  Red and swollen patches (lesions) on the skin.  Tears or cracks in the skin.  Bruising.  Blood blisters.  Severe itching.  You may also have pain, itching, or burning with urination. Constipation is also common in people with  lichen sclerosus. How is this diagnosed? This condition may be diagnosed with a physical exam. In some cases, a tissue sample (biopsy sample) may be removed to be looked at under a microscope. How is this treated? This condition is usually treated with medicated creams or ointments (topical steroids) that are applied over the affected areas. Follow these instructions at home:  Take over-the-counter and prescription medicines only as told by your health care provider.  Use creams or ointments as told by your health care provider.  Do not scratch the affected areas of skin.  Women should keep the vaginal area as clean and dry as possible.  Keep all follow-up visits as told by your health care provider. This is important. Contact a health care provider if:  You have increasing redness, swelling, or pain in the affected area.  You have fluid, blood, or pus coming from the affected area.  You have new lesions on your skin.  You have a fever.  You have pain during sex. This information is not intended to replace advice given to you by your health care provider. Make sure you discuss any questions you have with your health care provider. Document Released: 03/31/2011 Document Revised: 04/15/2016 Document Reviewed: 02/03/2015 Elsevier Interactive Patient Education  [employer].

## 2018-04-12 LAB — URINALYSIS, COMPLETE W/RFL CULTURE
Bilirubin Urine: NEGATIVE
HYALINE CAST: NONE SEEN /LPF
KETONES UR: NEGATIVE
NITRITES URINE, INITIAL: NEGATIVE
PH: 5 (ref 5.0–8.0)
Protein, ur: NEGATIVE
SPECIFIC GRAVITY, URINE: 1.01 (ref 1.001–1.03)

## 2018-04-12 LAB — URINE CULTURE

## 2018-04-12 LAB — CULTURE INDICATED

## 2018-05-26 LAB — CUP PACEART INCLINIC DEVICE CHECK
Implantable Lead Implant Date: 20051013
Implantable Lead Location: 753859
Lead Channel Setting Pacing Amplitude: 2 V
Lead Channel Setting Pacing Amplitude: 3 V
Lead Channel Setting Pacing Pulse Width: 0.6 ms
Lead Channel Setting Sensing Sensitivity: 2 mV
MDC IDC LEAD IMPLANT DT: 20051013
MDC IDC LEAD LOCATION: 753860
MDC IDC PG IMPLANT DT: 20051013
MDC IDC SESS DTM: 20190705144154
Pulse Gen Model: 5356
Pulse Gen Serial Number: 1357232

## 2018-07-20 ENCOUNTER — Encounter: Payer: Self-pay | Admitting: Obstetrics & Gynecology

## 2018-07-20 ENCOUNTER — Other Ambulatory Visit: Payer: Self-pay | Admitting: Internal Medicine

## 2018-07-20 ENCOUNTER — Other Ambulatory Visit: Payer: Self-pay | Admitting: Obstetrics & Gynecology

## 2018-07-20 ENCOUNTER — Ambulatory Visit (INDEPENDENT_AMBULATORY_CARE_PROVIDER_SITE_OTHER): Payer: 59 | Admitting: Obstetrics & Gynecology

## 2018-07-20 VITALS — BP 130/88

## 2018-07-20 DIAGNOSIS — Z1231 Encounter for screening mammogram for malignant neoplasm of breast: Secondary | ICD-10-CM

## 2018-07-20 DIAGNOSIS — L292 Pruritus vulvae: Secondary | ICD-10-CM

## 2018-07-20 DIAGNOSIS — N904 Leukoplakia of vulva: Secondary | ICD-10-CM

## 2018-07-20 MED ORDER — CLOBETASOL PROPIONATE 0.05 % EX OINT: 1.0000 "application " | TOPICAL_OINTMENT | Freq: Every day | CUTANEOUS | 4 refills | Status: DC

## 2018-07-20 NOTE — Progress Notes (Signed)
    Lauren LefortGenetha Wilkins 08-Mar-1956 [id number]        62 y.o.  G0   RP: Vaginal itching re-increasing since 04/2018  HPI: Vaginal itching probably associated with Lichen sclerosus diagnosed in 03/2018.  Started on Clobetasol 0.05% ointment at that time.  Got better after 2 weeks of applying the ointment twice a day, but then her symptoms recurred as she decreased to 1 application per week.  No vaginal discharge.  No UTI Sx.  Abstinent.  S/P Total Hysterectomy.  Menopause on no HRT.   OB History  Gravida Para Term Preterm AB Living  0 0 0 0 0 0  SAB TAB Ectopic Multiple Live Births  0 0 0 0 0    Past medical history,surgical history, problem list, medications, allergies, family history and social history were all reviewed and documented in the EPIC chart.   Directed ROS with pertinent positives and negatives documented in the history of present illness/assessment and plan.  Exam:  Vitals:   07/20/18 0950  BP: 130/88   General appearance:  Normal  Abdomen: Normal  Gynecologic exam: Vulva white atrophy and inflammation in butterfly distribution.  Vaginal culture done.  Bimanual exam:  S/P TAH.  No pelvic cyst/mass felt, NT.  Wet prep culture done, pending.   Assessment/Plan:  62 y.o. G0P0000   1. Vulvar itching Vaginal culture done to rule out yeast vaginitis.  Will wait on results before treating at patient's preference.  2. Lichen sclerosus et atrophicus of the vulva Leak and sclerosis of the vulva requiring more frequent clobetasol ointment applications.  Will apply once a day for 2 weeks then decrease to 3 times a week long time.  Other orders - clobetasol ointment (TEMOVATE) 0.05 %; Apply 1 application topically daily for 14 days. Thin vulvar/perianal application.  After 2 weeks, apply 3 times a week long term.  Counseling on above issues and coordination of care more than 50% for 25 minutes.  Lauren DelMarie-Lyne Lucila Klecka MD, 9:53 AM 07/20/2018

## 2018-07-21 ENCOUNTER — Telehealth: Payer: Self-pay | Admitting: *Deleted

## 2018-07-21 MED ORDER — CLOBETASOL PROPIONATE 0.05 % EX OINT: TOPICAL_OINTMENT | CUTANEOUS | 4 refills | Status: DC

## 2018-07-21 NOTE — Telephone Encounter (Signed)
Patient was seen yesterday for visit and temovate cream was not pharmacy. Rx was sent on print. I re-sent Rx and placed on normal so pharmacy will receive Rx.

## 2018-07-22 ENCOUNTER — Encounter: Payer: Self-pay | Admitting: Obstetrics & Gynecology

## 2018-07-22 NOTE — Patient Instructions (Signed)
1. Vulvar itching Vaginal culture done to rule out yeast vaginitis.  Will wait on results before treating at patient's preference.  2. Lichen sclerosus et atrophicus of the vulva Leak and sclerosis of the vulva requiring more frequent clobetasol ointment applications.  Will apply once a day for 2 weeks then decrease to 3 times a week long time.  Other orders - clobetasol ointment (TEMOVATE) 0.05 %; Apply 1 application topically daily for 14 days. Thin vulvar/perianal application.  After 2 weeks, apply 3 times a week long term.  Lauren Wilkins, it was a pleasure seeing you today!  I will inform you of your results as soon as they are available.

## 2018-07-24 LAB — NUSWAB VAGINITIS (VG)
Candida albicans, NAA: POSITIVE — AB
Candida glabrata, NAA: NEGATIVE
Trich vag by NAA: NEGATIVE

## 2018-07-25 ENCOUNTER — Other Ambulatory Visit: Payer: Self-pay | Admitting: Obstetrics & Gynecology

## 2018-07-25 MED ORDER — TERCONAZOLE 0.8 % VA CREA: 1.0000 | TOPICAL_CREAM | Freq: Every day | VAGINAL | 1 refills | Status: DC

## 2018-08-02 ENCOUNTER — Ambulatory Visit: Payer: 59

## 2018-08-17 ENCOUNTER — Ambulatory Visit: Payer: 59

## 2018-08-25 ENCOUNTER — Ambulatory Visit
Admission: RE | Admit: 2018-08-25 | Discharge: 2018-08-25 | Disposition: A | Discharge status: Home / Self Care | Payer: 59 | Source: Ambulatory Visit | Attending: Internal Medicine | Admitting: Internal Medicine

## 2018-08-25 DIAGNOSIS — Z1231 Encounter for screening mammogram for malignant neoplasm of breast: Secondary | ICD-10-CM

## 2018-10-03 ENCOUNTER — Telehealth: Payer: Self-pay | Admitting: *Deleted

## 2018-10-03 NOTE — Telephone Encounter (Signed)
Patient was prescribed temovate ointment for Lichen sclerosis told to use 3 times weekly long term, she has been doing this since August and no improvement c/o terrible itching. She asked what else can should she do? Please advise

## 2018-10-04 MED ORDER — TERCONAZOLE 0.8 % VA CREA: 1.0000 | TOPICAL_CREAM | Freq: Every day | VAGINAL | 0 refills | Status: DC

## 2018-10-04 NOTE — Telephone Encounter (Signed)
Patient aware, Rx sent.  

## 2018-10-04 NOTE — Telephone Encounter (Signed)
Treat with Terazol 3.  If no improvement, schedule a visit to reevaluate.

## 2019-01-22 DIAGNOSIS — R0989 Other specified symptoms and signs involving the circulatory and respiratory systems: Secondary | ICD-10-CM

## 2019-02-09 ENCOUNTER — Other Ambulatory Visit: Payer: Self-pay | Admitting: Obstetrics & Gynecology

## 2019-04-12 ENCOUNTER — Other Ambulatory Visit: Payer: Self-pay | Admitting: Obstetrics & Gynecology

## 2019-05-17 ENCOUNTER — Other Ambulatory Visit: Payer: Self-pay | Admitting: Obstetrics & Gynecology

## 2019-05-21 ENCOUNTER — Telehealth: Payer: 59 | Admitting: Cardiovascular Disease

## 2019-06-06 ENCOUNTER — Encounter: Payer: 59 | Admitting: Cardiovascular Disease

## 2019-09-21 ENCOUNTER — Other Ambulatory Visit: Payer: Self-pay

## 2019-09-21 ENCOUNTER — Ambulatory Visit (INDEPENDENT_AMBULATORY_CARE_PROVIDER_SITE_OTHER): Payer: 59 | Admitting: Cardiovascular Disease

## 2019-09-21 ENCOUNTER — Telehealth: Payer: Self-pay | Admitting: Cardiovascular Disease

## 2019-09-21 ENCOUNTER — Encounter: Payer: Self-pay | Admitting: Cardiovascular Disease

## 2019-09-21 VITALS — BP 131/87 | Ht 65.0 in | Wt 235.0 lb

## 2019-09-21 DIAGNOSIS — I493 Ventricular premature depolarization: Secondary | ICD-10-CM | POA: Diagnosis not present

## 2019-09-21 DIAGNOSIS — E1169 Type 2 diabetes mellitus with other specified complication: Secondary | ICD-10-CM

## 2019-09-21 DIAGNOSIS — E7800 Pure hypercholesterolemia, unspecified: Secondary | ICD-10-CM

## 2019-09-21 DIAGNOSIS — R55 Syncope and collapse: Secondary | ICD-10-CM

## 2019-09-21 DIAGNOSIS — I10 Essential (primary) hypertension: Secondary | ICD-10-CM | POA: Diagnosis not present

## 2019-09-21 DIAGNOSIS — E669 Obesity, unspecified: Secondary | ICD-10-CM

## 2019-09-21 DIAGNOSIS — Z950 Presence of cardiac pacemaker: Secondary | ICD-10-CM

## 2019-09-21 DIAGNOSIS — Z86711 Personal history of pulmonary embolism: Secondary | ICD-10-CM

## 2019-09-21 MED ORDER — CARVEDILOL 12.5 MG PO TABS: 12.5000 mg | ORAL_TABLET | Freq: Two times a day (BID) | ORAL | 3 refills | Status: DC

## 2019-09-21 NOTE — Telephone Encounter (Signed)
Pt called back. Set up appt for 1:30 in the office.

## 2019-09-21 NOTE — Patient Instructions (Addendum)
Medication Instructions:  INCREASE the Carvedilol to 12.5 mg twice daily  *If you need a refill on your cardiac medications before your next appointment, please call your pharmacy*  Lab Work: None ordered If you have labs (blood work) drawn today and your tests are completely normal, you will receive your results only by: Marland Kitchen MyChart Message (if you have MyChart) OR . A paper copy in the mail If you have any lab test that is abnormal or we need to change your treatment, we will call you to review the results.  Testing/Procedures: None ordered  Follow-Up: At [facility], you and your health needs are our priority.  As part of our continuing mission to provide you with exceptional heart care, we have created designated Provider Care Teams.  These Care Teams include your primary Cardiologist (physician) and Advanced Practice Providers (APPs -  Physician Assistants and Nurse Practitioners) who all work together to provide you with the care you need, when you need it.  Your next appointment:   6 months with the device clinic for a download 12 months with Dr. Sallyanne Kuster

## 2019-09-21 NOTE — Telephone Encounter (Signed)
It's possible that her pacemaker has reached end of battery life and is causing these complaints. Can we get her to device clinic today? If not possible, have her come to [facility] this afternoon and I will make time to check her device myself.

## 2019-09-21 NOTE — Telephone Encounter (Signed)
LM2CB to be seen in office today.

## 2019-09-21 NOTE — Telephone Encounter (Signed)
Pt called in with "throbbing" chest pain that comes and goes for the past 3 days. She states she is having some back pain but does not know if it is from the chest pain or from sitting at a desk all day. Pt denied any SOB. No current vitals. Not currently having chest pain but just happened 30 mins ago. Has a appt with device clinic next week. Will forward to Dr Sallyanne Kuster for review. Advised pt if pain returns and does not subside or gets SOB to call 911.

## 2019-09-21 NOTE — Telephone Encounter (Signed)
° ° °  Patient calling to report pain near breast/chest area, left side. No other symptoms

## 2019-09-21 NOTE — Progress Notes (Signed)
Cardiology Office Note    Date:  09/21/2019   ID:  Lauren LefortGenetha Wilkins, DOB 06/20/1956, MRN [id number]  PCP:  Laqueta DueFurr, Sara M., MD  Cardiologist:   Thurmon FairMihai Deedee Lybarger, MD   No chief complaint on file.   History of Present Illness:  Lauren Wilkins is a 63 y.o. 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  Since her last evaluation she has not had syncope (none since pacemaker implanted).    She called because of brief, but recurrent episodes of "throbbing" precordial pain, lasting a split second at rest. I asked he to come in for a device check (first since March 2019), since I was worried she had reached ERI and had asynchronous pacing.  She is having frequent PVCs on ECG and device check. She denies coffee or stimulants. Sleeping poorly and it is the anniversary of her brother's unexpected and mysterious death  The patient specifically denies any chest pain at rest or exertion, dyspnea at rest or with exertion, orthopnea, paroxysmal nocturnal dyspnea, syncope, palpitations, focal neurological deficits, intermittent claudication, lower extremity edema, unexplained weight gain, cough, hemoptysis or wheezing.   Pacemaker interrogation shows normal device function. The St. Jude Verity device cannot be remotely checked. She has <1% pacing in either the atrium or the ventricle. Estimated generator longevity is 0.75-2.25 years. Lead parameters are stable (checked manually today, V threshold chronically moderately high output adjusted today). No significant tachyarrhythmia recorded.  Past Medical History:  Diagnosis Date  . Cardiac pacemaker 2005   dual chamber for sinus pauses  . Chest pressure, resolved 01/22/2012  . Diabetes mellitus   . DM (diabetes mellitus) (HCC) 01/22/2012  . Dysrhythmia   . History of pulmonary embolism   . Hyperlipidemia   . Hypertension   . Hypokalemia 01/22/2012     Past Surgical History:  Procedure Laterality Date  . ABDOMINAL HYSTERECTOMY  07/2002  . HERNIA REPAIR    . INSERT / REPLACE / REMOVE PACEMAKER    . PACEMAKER INSERTION  08/2004   St. Jude [employer] Robie RidgeVerity XLDR    Current Medications: Outpatient Medications Prior to Visit  Medication Sig Dispense Refill  . amLODipine (NORVASC) 10 MG tablet Take 1 tablet by mouth daily.    Marland Kitchen. atorvastatin (LIPITOR) 40 MG tablet Take 1 tablet by mouth daily.    Marland Kitchen. ezetimibe (ZETIA) 10 MG tablet Take by mouth.    Marland Kitchen. JARDIANCE 25 MG TABS tablet Take 25 mg by mouth daily.  1  . LEVEMIR FLEXTOUCH 100 UNIT/ML Pen INJECT 46 UNITS DAILY AT BED TIME. REDUCE TO 35 UNITS IF SUGAR IS LESS THAN 70  2  . metFORMIN (GLUCOPHAGE-XR) 500 MG 24 hr tablet Take 2 tablets by mouth 2 (two) times daily.    . carvedilol (COREG) 6.25 MG tablet Take 6.25 mg by mouth 2 (two) times daily.  6  . BD PEN NEEDLE NANO U/F 32G X 4 MM MISC   0  . clobetasol ointment (TEMOVATE) 0.05 % Apply 1 applicator daily thin vulvar/perianal, after 2 weeks, then apply 3 times a week long term. (Patient not taking: Reported on 09/21/2019) 60 g 4  . ONETOUCH VERIO test strip Pt tests sugars twice daily.    Marland Kitchen. terconazole (TERAZOL 3) 0.8 % vaginal cream PLACE 1 APPLICATOR VAGINALLY AT BEDTIME. (Patient not taking: Reported on 09/21/2019) 20 g 2   No facility-administered medications prior to visit.  Allergies:   Patient has no known allergies.   Social History   Socioeconomic History  . Marital status: Single    Spouse name: Not on file  . Number of children: Not on file  . Years of education: 102  . Highest education level: Not on file  Occupational History  . Not on file  Social Needs  . Financial resource strain: Not on file  . Food insecurity    Worry: Not on file    Inability: Not on file  . Transportation needs    Medical: Not on file    Non-medical: Not on file  Tobacco Use  . Smoking status: Never Smoker  . Smokeless tobacco:  Never Used  Substance and Sexual Activity  . Alcohol use: No  . Drug use: No  . Sexual activity: Not Currently    Comment: 1st intercourse- 19, partners- 5,   Lifestyle  . Physical activity    Days per week: Not on file    Minutes per session: Not on file  . Stress: Not on file  Relationships  . Social Musician on phone: Not on file    Gets together: Not on file    Attends religious service: Not on file    Active member of club or organization: Not on file    Attends meetings of clubs or organizations: Not on file    Relationship status: Not on file  Other Topics Concern  . Not on file  Social History Narrative  . Not on file     Family History:  The patient's family history includes Alzheimer's disease in her mother; Arthritis in her sister; Breast cancer in her mother and sister; Coronary artery disease in her father; Diabetes in her sister; Heart attack in her father; Hypertension in her mother.   ROS:   Please see the history of present illness.    ROS All other systems are reviewed and are negative.   PHYSICAL EXAM:   VS:  BP 131/87   Ht 5\' 5"  (1.651 m)   Wt 235 lb (106.6 kg)   BMI 39.11 kg/m      General: Alert, oriented x3, no distress, severely obese Head: no evidence of trauma, PERRL, EOMI, no exophtalmos or lid lag, no myxedema, no xanthelasma; normal ears, nose and oropharynx Neck: normal jugular venous pulsations and no hepatojugular reflux; brisk carotid pulses without delay and no carotid bruits Chest: clear to auscultation, no signs of consolidation by percussion or palpation, normal fremitus, symmetrical and full respiratory excursions Cardiovascular: normal position and quality of the apical impulse, regular rhythm, normal first and second heart sounds, no murmurs, rubs or gallops Abdomen: no tenderness or distention, no masses by palpation, no abnormal pulsatility or arterial bruits, normal bowel sounds, no hepatosplenomegaly Extremities: no  clubbing, cyanosis or edema; 2+ radial, ulnar and brachial pulses bilaterally; 2+ right femoral, posterior tibial and dorsalis pedis pulses; 2+ left femoral, posterior tibial and dorsalis pedis pulses; no subclavian or femoral bruits Neurological: grossly nonfocal Psych: Normal mood and affect    Wt Readings from Last 3 Encounters:  09/21/19 235 lb (106.6 kg)  04/03/18 241 lb (109.3 kg)  03/24/18 240 lb (108.9 kg)      Studies/Labs Reviewed:   EKG:  EKG is ordered today.  It shows NSR, frequent PVCs otherwise normal  Recent Labs: Primary care provider November 28, 2017 Total cholesterol 299, LDL 220, HDL 53, triglycerides 215 Hemoglobin A1c 10.9% January 11, 2018 Creatinine 1.04, potassium  4.3  BMET    Component Value Date/Time   NA 139 03/24/2018 1350   NA 138 07/30/2013   K 3.8 03/24/2018 1350   CL 107 03/24/2018 1350   CO2 23 03/24/2018 1350   GLUCOSE 184 (H) 03/24/2018 1350   BUN 19 03/24/2018 1350   BUN 19 07/30/2013   CREATININE 0.83 03/24/2018 1350   CALCIUM 10.2 03/24/2018 1350   GFRNONAA >60 03/24/2018 1350   GFRAA >60 03/24/2018 1350  \ Lipid Panel    Component Value Date/Time   CHOL 264 (H) 11/07/2015 1115   TRIG 167.0 (H) 11/07/2015 1115   HDL 45.10 11/07/2015 1115   CHOLHDL 6 11/07/2015 1115   VLDL 33.4 11/07/2015 1115   LDLCALC 185 (H) 11/07/2015 1115    ASSESSMENT:    1. Neurocardiogenic syncope   2. Essential hypertension   3. PVCs (premature ventricular contractions)   4. Diabetes mellitus type 2 in obese ([place])   5. Severe obesity (BMI 35.0-39.9) with comorbidity ([place])   6. History of pulmonary embolism   7. Pacemaker   8. Hypercholesterolemia      PLAN:  In order of problems listed above:  1. History of syncope: Retrospectively, this was clearly a neurally mediated (vasovagal) event with cardioinhibitory component interpreted as "sinus arrest", no recurrence since pacemaker was implanted. 2. HTN: target BP<130/80 important w DM,  increase carvedilol 3. PVCs: I think the throbbing sensation in her chest may represent PVCs. She will have labs checked by PCP on Monday. Increase carvedilol dose. I wonder if the higher burden is triggered by the sad anniversary this month. 4. DM:  Poorly controlled, recently started on a new agent 5. Obesity: successful weight loss, now BMI just under 40. 6. Remote history of pulmonary embolism, no longer on anticoagulants 7. PPM: Her device is not capable of remote monitoring. . Reinforced need for Q6 month check, since device could reach ERI in under 1 year. 8. HLP: target LDL<100. Labs scheduled for Monday.    Medication Adjustments/Labs and Tests Ordered: Current medicines are reviewed at length with the patient today.  Concerns regarding medicines are outlined above.  Medication changes, Labs and Tests ordered today are listed in the Patient Instructions below. Patient Instructions  Medication Instructions:  INCREASE the Carvedilol to 12.5 mg twice daily  *If you need a refill on your cardiac medications before your next appointment, please call your pharmacy*  Lab Work: None ordered If you have labs (blood work) drawn today and your tests are completely normal, you will receive your results only by: Marland Kitchen MyChart Message (if you have MyChart) OR . A paper copy in the mail If you have any lab test that is abnormal or we need to change your treatment, we will call you to review the results.  Testing/Procedures: None ordered  Follow-Up: At [facility], you and your health needs are our priority.  As part of our continuing mission to provide you with exceptional heart care, we have created designated Provider Care Teams.  These Care Teams include your primary Cardiologist (physician) and Advanced Practice Providers (APPs -  Physician Assistants and Nurse Practitioners) who all work together to provide you with the care you need, when you need it.  Your next appointment:   6 months  with the device clinic for a download 12 months with Dr. Sallyanne Kuster       Signed, Sanda Klein, MD  09/21/2019 6:42 PM    [address],  [place]  [address] Phone: [phone]; Fax: [phone]

## 2019-10-10 ENCOUNTER — Telehealth: Payer: Self-pay | Admitting: Cardiovascular Disease

## 2019-10-10 NOTE — Telephone Encounter (Signed)
Called pt and left message informing pt that her medication was sent to her pharmacy on 09/21/19 with a year supply of carvedilol and if she has any other problems, questions or concerns, to call the office back.

## 2019-10-16 ENCOUNTER — Telehealth: Payer: Self-pay | Admitting: Cardiovascular Disease

## 2019-10-16 NOTE — Telephone Encounter (Signed)
Spoke with pt who states that Dr. Sallyanne Kuster requested that she update him with the results of recent lab work but she cannot remember which lab. Reviewed 10/30 OV note which stated under Plan: "HLP: target LDL<100. Labs scheduled for Monday."   Informed pt of findings. Will forward to Dr. Sallyanne Kuster to confirm

## 2019-10-16 NOTE — Telephone Encounter (Signed)
  Pt reports the following:  October 01, 2019 Lipid Panel:  HDL: 53 LDL: 94 Non LDL HDL: 113 Total cholesterol: 166 Triglycerides: 92 Cholesterol/HDL ratio: 3.1  Requested that pt confirm if there are more readings on lab. Pt states she is having issues opening the document back up. Requests that triage nurse call back in 15 min.  Contacted pt back. List above updated

## 2019-10-16 NOTE — Telephone Encounter (Signed)
New Message   Patient is calling because Dr. Sallyanne Kuster asked for results for a certain lab and she can not remember what lab. Please call to discuss.

## 2019-10-16 NOTE — Telephone Encounter (Signed)
Follow Up:    Pt calling back said she was supposed to call back.

## 2019-10-16 NOTE — Telephone Encounter (Signed)
All those parameters on her cholesterol profile are within the disirable range. Thank her for getting that info to [place].

## 2019-10-17 NOTE — Telephone Encounter (Signed)
Left a message with the patient with Dr. Victorino December message. She has been advised to call back if anything further was needed.

## 2019-11-29 ENCOUNTER — Emergency Department (HOSPITAL_BASED_OUTPATIENT_CLINIC_OR_DEPARTMENT_OTHER)
Admission: EM | Admit: 2019-11-29 | Discharge: 2019-11-29 | Disposition: A | Discharge status: Home / Self Care | Attending: Emergency Medicine | Admitting: Emergency Medicine

## 2019-11-29 ENCOUNTER — Emergency Department (HOSPITAL_BASED_OUTPATIENT_CLINIC_OR_DEPARTMENT_OTHER)

## 2019-11-29 ENCOUNTER — Other Ambulatory Visit: Payer: Self-pay

## 2019-11-29 ENCOUNTER — Encounter (HOSPITAL_BASED_OUTPATIENT_CLINIC_OR_DEPARTMENT_OTHER): Payer: Self-pay

## 2019-11-29 DIAGNOSIS — Y929 Unspecified place or not applicable: Secondary | ICD-10-CM | POA: Diagnosis not present

## 2019-11-29 DIAGNOSIS — E785 Hyperlipidemia, unspecified: Secondary | ICD-10-CM | POA: Insufficient documentation

## 2019-11-29 DIAGNOSIS — Z950 Presence of cardiac pacemaker: Secondary | ICD-10-CM | POA: Diagnosis not present

## 2019-11-29 DIAGNOSIS — Y9389 Activity, other specified: Secondary | ICD-10-CM | POA: Diagnosis not present

## 2019-11-29 DIAGNOSIS — Z79899 Other long term (current) drug therapy: Secondary | ICD-10-CM | POA: Insufficient documentation

## 2019-11-29 DIAGNOSIS — S46911A Strain of unspecified muscle, fascia and tendon at shoulder and upper arm level, right arm, initial encounter: Secondary | ICD-10-CM | POA: Insufficient documentation

## 2019-11-29 DIAGNOSIS — E119 Type 2 diabetes mellitus without complications: Secondary | ICD-10-CM | POA: Insufficient documentation

## 2019-11-29 DIAGNOSIS — X503XXA Overexertion from repetitive movements, initial encounter: Secondary | ICD-10-CM | POA: Diagnosis not present

## 2019-11-29 DIAGNOSIS — S4991XA Unspecified injury of right shoulder and upper arm, initial encounter: Secondary | ICD-10-CM | POA: Diagnosis present

## 2019-11-29 DIAGNOSIS — I10 Essential (primary) hypertension: Secondary | ICD-10-CM | POA: Diagnosis not present

## 2019-11-29 DIAGNOSIS — R030 Elevated blood-pressure reading, without diagnosis of hypertension: Secondary | ICD-10-CM

## 2019-11-29 DIAGNOSIS — Y999 Unspecified external cause status: Secondary | ICD-10-CM | POA: Insufficient documentation

## 2019-11-29 NOTE — ED Triage Notes (Signed)
Pt sent here for evaluation of right shoulder due to pain on and off since February.  Decreased ROM.  Taking ibuprofen for pain.  Last taken last night.

## 2019-11-29 NOTE — ED Provider Notes (Signed)
[facility] Provider Note   CSN: [id number] Arrival date & time: 11/29/19  0957     History Chief Complaint  Patient presents with   Shoulder Pain    Shylynn Bruning is a 64 y.o. female history diabetes, pacemaker, hypertension, hyperlipidemia, PE.  Patient presents today for right shoulder pain.  She reports she has had intermittent pain of the right shoulder for the past 11 months.  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  She reports that in February she was having this same pain but was laid off at her job.  She reports that her shoulder pain had since resolved but over the past few weeks she has again begun working.  She reports worsening pain over the past 1 week, aching sensation right shoulder nonradiating only present with reaching above head and palpation improved with rest and moderate in intensity.  She denies fall/injury, fever/chills, swelling/color change, numbness/tingling, weakness, chest pain/shortness of breath, neck pain, cough/hemoptysis or any additional concerns.  HPI     Past Medical History:  Diagnosis Date   Cardiac pacemaker 2005   dual chamber for sinus pauses   Chest pressure, resolved 01/22/2012   Diabetes mellitus    DM (diabetes mellitus) ([place]) 01/22/2012   Dysrhythmia    History of pulmonary embolism    Hyperlipidemia    Hypertension    Hypokalemia 01/22/2012    Patient Active Problem List   Diagnosis Date Noted   Chest pain 03/24/2018   Diabetes mellitus type 2 in obese ([place]) 02/17/2018   Vasovagal syncope 02/17/2018   History of syncope 06/16/2016   Hypercholesterolemia 11/07/2015   Vitamin D deficiency 04/16/2015   Frequent urination 11/05/2014   Fatigue 11/05/2014   Abnormal urine odor 08/29/2014   Routine general medical examination at a health care facility 04/22/2014   Morbid obesity ([place]) 04/22/2014   Other and  unspecified hyperlipidemia 10/22/2013   Pacemaker 04/24/2013   History of pulmonary embolism 04/24/2013   Sinus arrest 04/24/2013   HTN (hypertension) 04/24/2013   Chest pressure, resolved 01/22/2012   DM (diabetes mellitus) ([place]) 01/22/2012   Hypokalemia 01/22/2012    Past Surgical History:  Procedure Laterality Date   ABDOMINAL HYSTERECTOMY  07/2002   HERNIA REPAIR     INSERT / REPLACE / REMOVE PACEMAKER     PACEMAKER INSERTION  08/2004   St. Jude Medical Verity XLDR     OB History    Gravida  0   Para  0   Term  0   Preterm  0   AB  0   Living  0     SAB  0   TAB  0   Ectopic  0   Multiple  0   Live Births  0           Family History  Problem Relation Age of Onset   Coronary artery disease Father    Heart attack Father    Breast cancer Mother    Alzheimer's disease Mother    Hypertension Mother    Diabetes Sister    Arthritis Sister    Breast cancer Sister     Social History   Tobacco Use   Smoking status: Never Smoker   Smokeless tobacco: Never Used  Substance Use Topics   Alcohol use: No   Drug use: No    Home Medications Prior to Admission medications   Medication Sig Start  Date End Date Taking? Authorizing Provider  amLODipine (NORVASC) 10 MG tablet Take 1 tablet by mouth daily. 03/24/16   [provider]  atorvastatin (LIPITOR) 40 MG tablet Take 1 tablet by mouth daily. 04/06/16   [provider]  BD PEN NEEDLE NANO U/F 32G X 4 MM MISC  09/15/14   [provider]  carvedilol (COREG) 12.5 MG tablet Take 1 tablet (12.5 mg total) by mouth 2 (two) times daily. 09/21/19   Croitoru, Mihai, MD  clobetasol ointment (TEMOVATE) 0.05 % Apply 1 applicator daily thin vulvar/perianal, after 2 weeks, then apply 3 times a week long term. Patient not taking: Reported on 09/21/2019 07/21/18   Genia Del, MD  ezetimibe (ZETIA) 10 MG tablet Take by mouth. 12/20/17   [provider]    JARDIANCE 25 MG TABS tablet Take 25 mg by mouth daily. 01/18/18   [provider]  LEVEMIR FLEXTOUCH 100 UNIT/ML Pen INJECT 46 UNITS DAILY AT BED TIME. REDUCE TO 35 UNITS IF SUGAR IS LESS THAN 70 08/24/15   [provider]  metFORMIN (GLUCOPHAGE-XR) 500 MG 24 hr tablet Take 2 tablets by mouth 2 (two) times daily. 04/29/16   [provider]  Letta Pate VERIO test strip Pt tests sugars twice daily. 08/01/13   [provider]  terconazole (TERAZOL 3) 0.8 % vaginal cream PLACE 1 APPLICATOR VAGINALLY AT BEDTIME. Patient not taking: Reported on 09/21/2019 05/17/19   Genia Del, MD    Allergies    Patient has no known allergies.  Review of Systems   Review of Systems Ten systems are reviewed and are negative for acute change except as noted in the HPI  Physical Exam Updated Vital Signs BP (!) 169/107 (BP Location: Left Arm)    Pulse 100    Temp 97.6 F (36.4 C) (Oral)    Resp 14    Ht 5\' 5"  (1.651 m)    Wt 106.6 kg    SpO2 100%    BMI 39.11 kg/m   Physical Exam Constitutional:      General: She is not in acute distress.    Appearance: Normal appearance. She is well-developed. She is not ill-appearing or diaphoretic.  HENT:     Head: Normocephalic and atraumatic.     Right Ear: External ear normal.     Left Ear: External ear normal.     Nose: Nose normal.  Eyes:     General: Vision grossly intact. Gaze aligned appropriately.     Pupils: Pupils are equal, round, and reactive to light.  Neck:     Trachea: Trachea and phonation normal. No tracheal deviation.  Cardiovascular:     Rate and Rhythm: Normal rate and regular rhythm.     Pulses:          Radial pulses are 2+ on the right side and 2+ on the left side.  Pulmonary:     Effort: Pulmonary effort is normal. No respiratory distress.  Abdominal:     General: There is no distension.     Palpations: Abdomen is soft.     Tenderness: There is no abdominal tenderness. There is no guarding or rebound.   Musculoskeletal:        General: Normal range of motion.     Cervical back: Normal range of motion.     Comments: Cervical Spine: Appearance normal. No obvious bony deformity. No skin swelling, erythema, heat, fluctuance or break of the skin. No TTP over the cervical spinous processes. No paraspinal tenderness. No  step-offs. Patient is able to actively rotate their neck 45 degrees left and right voluntarily without pain and flex and extend the neck without pain. - Right Shoulder: Appearance normal. No obvious bony deformity. No skin swelling, erythema, heat, fluctuance or break of the skin. No clavicular deformity or TTP. TTP over right deltoid. Active and passive flexion, extension, abduction, adduction, and internal/external rotation without crepitus, decreased range of motion above shoulder level and increased pain with all movements.   - Right Elbow: Appearance normal. No obvious bony deformity. No skin swelling, erythema, heat, fluctuance or break of the skin. No TTP over joint. Active flexion, extension, supination and pronation full and intact without pain. Strength able and appropriate for age for flexion and extension.   Radial Pulse 2+. Cap refill <2 seconds. SILT for M/U/R distributions. Compartments soft.   Skin:    General: Skin is warm and dry.  Neurological:     Mental Status: She is alert.     GCS: GCS eye subscore is 4. GCS verbal subscore is 5. GCS motor subscore is 6.     Comments: Speech is clear and goal oriented, follows commands Major Cranial nerves without deficit, no facial droop Moves extremities without ataxia, coordination intact Grip strength equal bilaterally.  Psychiatric:        Behavior: Behavior normal.     ED Results / Procedures / Treatments   Labs (all labs ordered are listed, but only abnormal results are displayed) Labs Reviewed - No data to display  EKG None  Radiology DG Shoulder Right  Result Date: 11/29/2019 CLINICAL DATA:  Right shoulder  pain for 1 year.  No known injury. EXAM: RIGHT SHOULDER - 2+ VIEW COMPARISON:  None. FINDINGS: There is no evidence of fracture or dislocation. Mild acromioclavicular osteoarthritis is noted. Soft tissues are unremarkable. IMPRESSION: Mild acromioclavicular osteoarthritis.  Otherwise negative. Electronically Signed   By: Drusilla Kanner M.D.   On: 11/29/2019 10:30    Procedures Procedures (including critical care time)  Medications Ordered in ED Medications - No data to display  ED Course  I have reviewed the triage vital signs and the nursing notes.  Pertinent labs & imaging results that were available during my care of the patient were reviewed by me and considered in my medical decision making (see chart for details).    MDM Rules/Calculators/A&P                     DG Right Shoulder:  IMPRESSION:  Mild acromioclavicular osteoarthritis. Otherwise negative.   64 year old female presents today with right shoulder pain.  Pain is consistently reproducible to palpation of the right deltoid muscle and with movement.  She denies any associated chest pain, difficulty breathing, swelling or neurologic complaint.  She is neurovascular intact to the extremity with good capillary refill sensation and radial pulse.  Movements of neck, wrist, and hand and elbow intact without pain.  Her right shoulder pain is primarily with movements above shoulder level.  There is no evidence of cellulitis, DVT, septic arthritis, compartment syndrome, radiculopathy or other emergent pathologies.  X-ray today shows osteoarthritis.  Patient reports that she had similar pain with previous job in February and pain has only recently returned with reaching overhead into shelves at work.  Suspect musculoskeletal etiology of her symptoms today.  There is no gross ligamentous laxity.  Patient placed in sling and given referral to orthopedics.  Encouraged to call today to schedule follow-up appointment.  Additionally patient  noted to have  elevated blood pressure in the ED, she is asymptomatic regarding her blood pressure.  I have advised her to take all medications as prescribed by PCP and to schedule follow-up appointment for blood pressure recheck and medication management within 1 week.  Patient informed of signs/symptoms of hypertensive urgency/emergency and to return to the ER if they occur.  At this time there does not appear to be any evidence of an acute emergency medical condition and the patient appears stable for discharge with appropriate outpatient follow up. Diagnosis was discussed with patient who verbalizes understanding of care plan and is agreeable to discharge. I have discussed return precautions with patient who verbalizes understanding of return precautions. Patient encouraged to follow-up with their PCP and ortho. All questions answered.  Note: Portions of this report may have been transcribed using voice recognition software. Every effort was made to ensure accuracy; however, inadvertent computerized transcription errors may still be present. Final Clinical Impression(s) / ED Diagnoses Final diagnoses:  Strain of right shoulder, initial encounter  Elevated blood pressure reading    Rx / DC Orders ED Discharge Orders    None       Elizabeth Palau 11/29/19 1111    Tilden Fossa, MD 11/30/19 [phone]

## 2019-11-29 NOTE — Discharge Instructions (Addendum)
You have been diagnosed today with Right Shoulder Pain.  At this time there does not appear to be the presence of an emergent medical condition, however there is always the potential for conditions to change. Please read and follow the below instructions.  Please return to the Emergency Department immediately for any new or worsening symptoms. Please be sure to follow up with your Primary Care Provider within one week regarding your visit today; please call their office to schedule an appointment even if you are feeling better for a follow-up visit. Please continue to use your shoulder sling as given to you today to protect your right shoulder for further injury.  As your pain improves you may practice range of motion exercises gently to avoid stiffness.  Please call the orthopedic specialist Dr. Everardo Pacific on your discharge paperwork today to schedule a follow-up appointment for further evaluation and treatment. Additionally your blood pressure was noted to be elevated today.  Please take your blood pressure medications as prescribed by your primary care provider.  Please call your primary care provider today to schedule a follow-up appointment for blood pressure recheck and medication management.  Get help right away if: Your arm, hand, or fingers: Tingle. Are numb. Are swollen. Are painful. Turn white or blue. Get a very bad headache. Start to feel mixed up (confused). Feel weak or numb. Feel faint. Have very bad pain in your: Chest. Belly (abdomen). Throw up more than once. Have trouble breathing. You have any new/concerning or worsening of symptoms.   Please read the additional information packets attached to your discharge summary.  Do not take your medicine if  develop an itchy rash, swelling in your mouth or lips, or difficulty breathing; call 911 and seek immediate emergency medical attention if this occurs.  Note: Portions of this text may have been transcribed using voice  recognition software. Every effort was made to ensure accuracy; however, inadvertent computerized transcription errors may still be present.

## 2020-01-11 ENCOUNTER — Other Ambulatory Visit: Payer: Self-pay | Admitting: Obstetrics & Gynecology

## 2020-02-13 ENCOUNTER — Other Ambulatory Visit: Payer: Self-pay | Admitting: Internal Medicine

## 2020-02-13 DIAGNOSIS — Z1231 Encounter for screening mammogram for malignant neoplasm of breast: Secondary | ICD-10-CM

## 2020-02-16 ENCOUNTER — Ambulatory Visit: Payer: Self-pay | Attending: Internal Medicine

## 2020-02-16 DIAGNOSIS — Z23 Encounter for immunization: Secondary | ICD-10-CM

## 2020-02-16 NOTE — Progress Notes (Signed)
   Covid-19 Vaccination Clinic  Name:  Mianna Iezzi    MRN: [id number] DOB: 08/17/1956  02/16/2020  Ms. Kocak was observed post Covid-19 immunization for 15 minutes without incident. She was provided with Vaccine Information Sheet and instruction to access the V-Safe system.   Ms. Rudie was instructed to call 911 with any severe reactions post vaccine: Marland Kitchen Difficulty breathing  . Swelling of face and throat  . A fast heartbeat  . A bad rash all over body  . Dizziness and weakness   Immunizations Administered    Name Date Dose VIS Date Route   Pfizer COVID-19 Vaccine 02/16/2020  2:24 PM 0.3 mL 11/02/2019 Intramuscular   Manufacturer: [employer], [employer]   Lot: [id number]   NDC: [address]-9

## 2020-02-26 ENCOUNTER — Ambulatory Visit: Payer: Self-pay

## 2020-03-05 ENCOUNTER — Encounter: Payer: 59 | Admitting: Obstetrics & Gynecology

## 2020-03-06 ENCOUNTER — Ambulatory Visit: Payer: Self-pay

## 2020-03-11 ENCOUNTER — Ambulatory Visit: Payer: Self-pay | Attending: Internal Medicine

## 2020-03-11 ENCOUNTER — Ambulatory Visit: Payer: Self-pay

## 2020-03-11 DIAGNOSIS — Z23 Encounter for immunization: Secondary | ICD-10-CM

## 2020-03-11 NOTE — Progress Notes (Signed)
   Covid-19 Vaccination Clinic  Name:  Lauren Wilkins    MRN: [id number] DOB: May 10, 1956  03/11/2020  [name] was observed post Covid-19 immunization for 15 minutes without incident. She was provided with Vaccine Information Sheet and instruction to access the V-Safe system.   [name] was instructed to call 911 with any severe reactions post vaccine: Marland Kitchen Difficulty breathing  . Swelling of face and throat  . A fast heartbeat  . A bad rash all over body  . Dizziness and weakness   Immunizations Administered    Name Date Dose VIS Date Route   Pfizer COVID-19 Vaccine 03/11/2020  8:34 AM 0.3 mL 01/16/2019 Intramuscular   Manufacturer: [employer], [employer]   Lot: [id number]   NDC: [address]-5

## 2020-04-29 ENCOUNTER — Ambulatory Visit: Payer: Self-pay

## 2020-06-18 ENCOUNTER — Other Ambulatory Visit: Payer: Self-pay | Admitting: Obstetrics & Gynecology

## 2020-06-19 ENCOUNTER — Encounter: Payer: Self-pay | Admitting: Obstetrics & Gynecology

## 2020-06-19 ENCOUNTER — Other Ambulatory Visit: Payer: Self-pay

## 2020-06-19 ENCOUNTER — Ambulatory Visit (INDEPENDENT_AMBULATORY_CARE_PROVIDER_SITE_OTHER): Payer: 59 | Admitting: Obstetrics & Gynecology

## 2020-06-19 VITALS — BP 132/86

## 2020-06-19 DIAGNOSIS — N904 Leukoplakia of vulva: Secondary | ICD-10-CM | POA: Diagnosis not present

## 2020-06-19 DIAGNOSIS — B9689 Other specified bacterial agents as the cause of diseases classified elsewhere: Secondary | ICD-10-CM

## 2020-06-19 DIAGNOSIS — L292 Pruritus vulvae: Secondary | ICD-10-CM

## 2020-06-19 DIAGNOSIS — N898 Other specified noninflammatory disorders of vagina: Secondary | ICD-10-CM

## 2020-06-19 DIAGNOSIS — N760 Acute vaginitis: Secondary | ICD-10-CM

## 2020-06-19 LAB — WET PREP FOR TRICH, YEAST, CLUE

## 2020-06-19 MED ORDER — TINIDAZOLE 500 MG PO TABS: 1000.0000 mg | ORAL_TABLET | Freq: Two times a day (BID) | ORAL | 0 refills | Status: AC

## 2020-06-19 MED ORDER — CLOBETASOL PROPIONATE 0.05 % EX OINT: TOPICAL_OINTMENT | CUTANEOUS | 4 refills | Status: DC

## 2020-06-19 NOTE — Progress Notes (Signed)
    Lauren Wilkins 1956/10/04 [id number]        64 y.o.  G0  RP: Recurrent vulvar/perianal itching.    HPI: S/P Total Hysterectomy.  C/O recurrent vulvar/perianal itching.  Mild vaginal discharge.  Diagnosed with lichen sclerosis of the vulva 2 years ago and is using clobetasol intermittently with improvement of symptoms when using it.   OB History  Gravida Para Term Preterm AB Living  0 0 0 0 0 0  SAB TAB Ectopic Multiple Live Births  0 0 0 0 0    Past medical history,surgical history, problem list, medications, allergies, family history and social history were all reviewed and documented in the EPIC chart.   Directed ROS with pertinent positives and negatives documented in the history of present illness/assessment and plan.  Exam:  Vitals:   06/19/20 0857  BP: (!) 132/86   General appearance:  Normal  Gynecologic exam: Vulva with severe white atrophy and inflammation with fissures at the perineum and perianal area.  Typical butterfly distribution of lichen sclerosis.  Mild increase in vaginal discharge.  Wet prep done.  Wet prep: Clue cells present.   Assessment/Plan:  64 y.o. G0  1. Vulvar itching Highly compatible with lichen sclerosis.  Counseling on lichen sclerosus and the fact that it is a chronic immunologic disease reinforced.  Will restart on clobetasol cream twice a day for 2 weeks and then maintains with applications 2-3 times a week.  Prescription sent to pharmacy.  2. Lichen sclerosus et atrophicus of the vulva As above.  3. Vaginal discharge BV confirmed by wet prep.  Will treat with tinidazole.  Usage reviewed and prescription sent to pharmacy.  4. BV (bacterial vaginosis) As above.  Other orders - tinidazole (TINDAMAX) 500 MG tablet; Take 2 tablets (1,000 mg total) by mouth 2 (two) times daily for 2 days. - clobetasol ointment (TEMOVATE) 0.05 %; Thin vulvar/perianal application twice a day for 2 weeks, then apply 2 to 3 times a week long  term.  Genia Del MD, 9:23 AM 06/19/2020

## 2020-06-19 NOTE — Addendum Note (Signed)
Addended by: Berna Spare A on: 06/19/2020 11:10 AM   Modules accepted: Orders

## 2020-08-06 ENCOUNTER — Encounter: Payer: 59 | Admitting: Obstetrics & Gynecology

## 2020-08-06 DIAGNOSIS — Z0289 Encounter for other administrative examinations: Secondary | ICD-10-CM

## 2020-09-23 ENCOUNTER — Ambulatory Visit: Payer: 59

## 2020-10-13 ENCOUNTER — Encounter: Payer: 59 | Admitting: Cardiovascular Disease

## 2021-04-01 ENCOUNTER — Other Ambulatory Visit: Payer: Self-pay | Admitting: Internal Medicine

## 2021-04-01 DIAGNOSIS — Z1231 Encounter for screening mammogram for malignant neoplasm of breast: Secondary | ICD-10-CM

## 2021-04-06 ENCOUNTER — Other Ambulatory Visit: Payer: Self-pay | Admitting: Internal Medicine

## 2021-04-06 DIAGNOSIS — Z13820 Encounter for screening for osteoporosis: Secondary | ICD-10-CM

## 2021-06-02 ENCOUNTER — Inpatient Hospital Stay: Admission: RE | Admit: 2021-06-02 | Payer: 59 | Source: Ambulatory Visit

## 2021-10-07 ENCOUNTER — Inpatient Hospital Stay: Admission: RE | Admit: 2021-10-07 | Payer: 59 | Source: Ambulatory Visit

## 2021-12-11 ENCOUNTER — Ambulatory Visit
Admission: RE | Admit: 2021-12-11 | Discharge: 2021-12-11 | Disposition: A | Discharge status: Home / Self Care | Payer: 59 | Source: Ambulatory Visit | Attending: Internal Medicine | Admitting: Internal Medicine

## 2021-12-11 DIAGNOSIS — Z1231 Encounter for screening mammogram for malignant neoplasm of breast: Secondary | ICD-10-CM

## 2021-12-21 ENCOUNTER — Encounter: Payer: 59 | Admitting: Cardiovascular Disease

## 2022-09-07 ENCOUNTER — Other Ambulatory Visit: Payer: Self-pay | Admitting: Internal Medicine

## 2022-09-07 ENCOUNTER — Other Ambulatory Visit: Payer: Self-pay | Admitting: Physician Assistant

## 2022-09-07 DIAGNOSIS — Z1231 Encounter for screening mammogram for malignant neoplasm of breast: Secondary | ICD-10-CM

## 2022-12-10 ENCOUNTER — Ambulatory Visit: Payer: Commercial Managed Care - HMO | Admitting: Podiatry

## 2022-12-13 ENCOUNTER — Ambulatory Visit
Admission: RE | Admit: 2022-12-13 | Discharge: 2022-12-13 | Disposition: A | Discharge status: Home / Self Care | Payer: Commercial Managed Care - HMO | Source: Ambulatory Visit | Attending: Physician Assistant | Admitting: Physician Assistant

## 2022-12-13 ENCOUNTER — Ambulatory Visit: Payer: Commercial Managed Care - HMO | Admitting: Podiatry

## 2022-12-13 ENCOUNTER — Ambulatory Visit (INDEPENDENT_AMBULATORY_CARE_PROVIDER_SITE_OTHER): Payer: Commercial Managed Care - HMO

## 2022-12-13 ENCOUNTER — Encounter: Payer: Self-pay | Admitting: Podiatry

## 2022-12-13 DIAGNOSIS — M722 Plantar fascial fibromatosis: Secondary | ICD-10-CM

## 2022-12-13 DIAGNOSIS — Z1231 Encounter for screening mammogram for malignant neoplasm of breast: Secondary | ICD-10-CM

## 2022-12-13 MED ORDER — TRIAMCINOLONE ACETONIDE 10 MG/ML IJ SUSP
10.0000 mg | Freq: Once | INTRAMUSCULAR | Status: AC
  Administered 2022-12-13: 10 mg

## 2022-12-13 MED ORDER — DICLOFENAC SODIUM 75 MG PO TBEC: 75.0000 mg | DELAYED_RELEASE_TABLET | Freq: Two times a day (BID) | ORAL | 2 refills | Status: AC

## 2022-12-13 NOTE — Progress Notes (Signed)
Subjective:   Patient ID: Lauren Wilkins, female   DOB: 67 y.o.   MRN: [id number]   HPI Patient presents stating she is developed a lot of pain in her right heel for the last month.  Does not remember any new activities or anything and it is getting gradually worse over that time the patient has trouble walking fully on it.  Patient does not smoke likes to be active   Review of Systems  All other systems reviewed and are negative.       Objective:  Physical Exam Vitals and nursing note reviewed.  Constitutional:      Appearance: She is well-developed.  Pulmonary:     Effort: Pulmonary effort is normal.  Musculoskeletal:        General: Normal range of motion.  Skin:    General: Skin is warm.  Neurological:     Mental Status: She is alert.     Neurovascular status found to be intact muscle strength found to be adequate range of motion within normal limits.  Patient is found to have exquisite discomfort plantar aspect right heel at the insertional point of the tendon calcaneus with fluid buildup around the medial band where it inserts into the bone.  Good digital perfusion well-oriented     Assessment:  Acute Planter fasciitis right with inflammation fluid buildup     Plan:  H&P went ahead today sterile prep and injected the fascia at insertion 3 mg Kenalog 5 mg Xylocaine applied fascial brace to lift up the arch and it was fitted properly into her arch and gave instructions on stretching exercises shoe gear modifications  X-rays indicate small spur no indications of stress fracture arthritis

## 2022-12-13 NOTE — Patient Instructions (Signed)

## 2022-12-27 ENCOUNTER — Ambulatory Visit: Payer: Commercial Managed Care - HMO | Admitting: Podiatry

## 2022-12-31 ENCOUNTER — Ambulatory Visit: Payer: Commercial Managed Care - HMO | Admitting: Cardiovascular Disease

## 2023-01-03 ENCOUNTER — Ambulatory Visit: Payer: Commercial Managed Care - HMO | Admitting: Podiatry

## 2023-01-03 ENCOUNTER — Encounter: Payer: Self-pay | Admitting: Podiatry

## 2023-01-03 DIAGNOSIS — M722 Plantar fascial fibromatosis: Secondary | ICD-10-CM

## 2023-01-03 NOTE — Progress Notes (Signed)
Subjective:   Patient ID: Lauren Wilkins, female   DOB: 67 y.o.   MRN: [id number]   HPI Patient states she has had improvement in her heel pain right.  States it still hurts at times but overall much better and the Achilles is also feeling better neurovascular   ROS      Objective:  Physical Exam  Notice intact diminished discomfort plantar fascia right with mild fluid buildup upon deep palpation but overall doing well with good digital perfusion well-oriented     Assessment:  Get improvement Planter fasciitis right inflammation fluid buildup     Plan:  Reviewed can edition recommended the continuation of physical therapy anti-inflammatories reviewed shoe gear modifications insert therapy we discussed the possibility for further treatment in the future depending on how this does.  Patient wants procedure and at this point signed consent form with all questions answered

## 2023-01-19 ENCOUNTER — Ambulatory Visit: Payer: Commercial Managed Care - HMO | Admitting: Cardiovascular Disease

## 2023-02-25 ENCOUNTER — Ambulatory Visit: Payer: Commercial Managed Care - HMO | Admitting: Cardiovascular Disease

## 2023-06-24 ENCOUNTER — Ambulatory Visit: Payer: Self-pay | Attending: Cardiovascular Disease | Admitting: Cardiovascular Disease

## 2023-06-29 LAB — EXTERNAL GENERIC LAB PROCEDURE: COLOGUARD: NEGATIVE

## 2023-08-08 IMAGING — MG MM DIGITAL SCREENING BILAT W/ TOMO AND CAD
8 series · 8 of 24 positions shown · non-contrast
Comparison: Previous exam(s).

CLINICAL DATA: Screening.

EXAM:
DIGITAL SCREENING BILATERAL MAMMOGRAM WITH TOMOSYNTHESIS AND CAD
TECHNIQUE: Bilateral screening digital craniocaudal and mediolateral oblique
mammograms were obtained. Bilateral screening digital breast
tomosynthesis was performed. The images were evaluated with
computer-aided detection.

[R CC synth-2D]
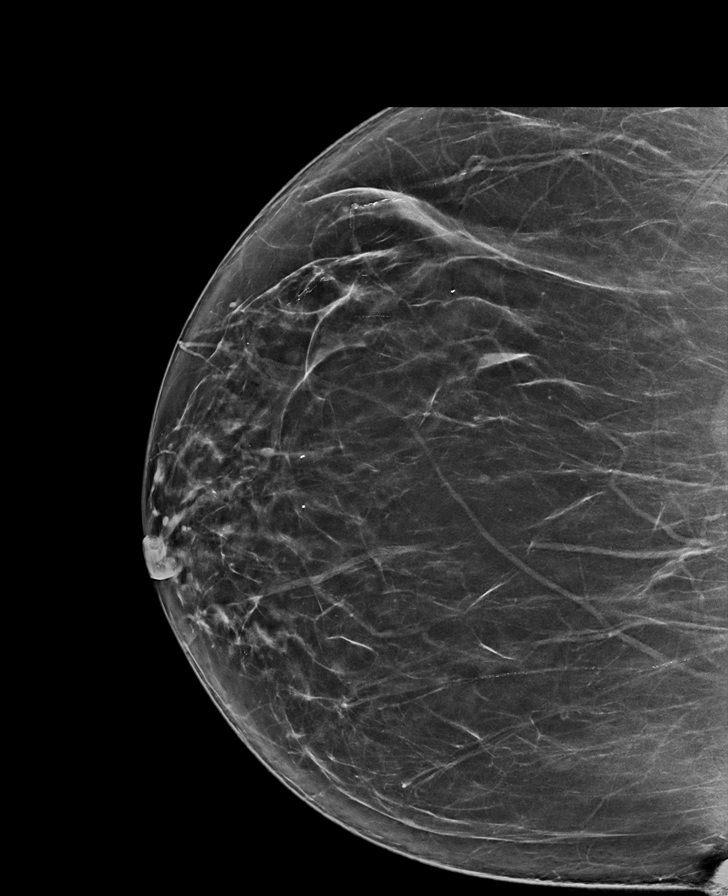

[R MLO synth-2D]
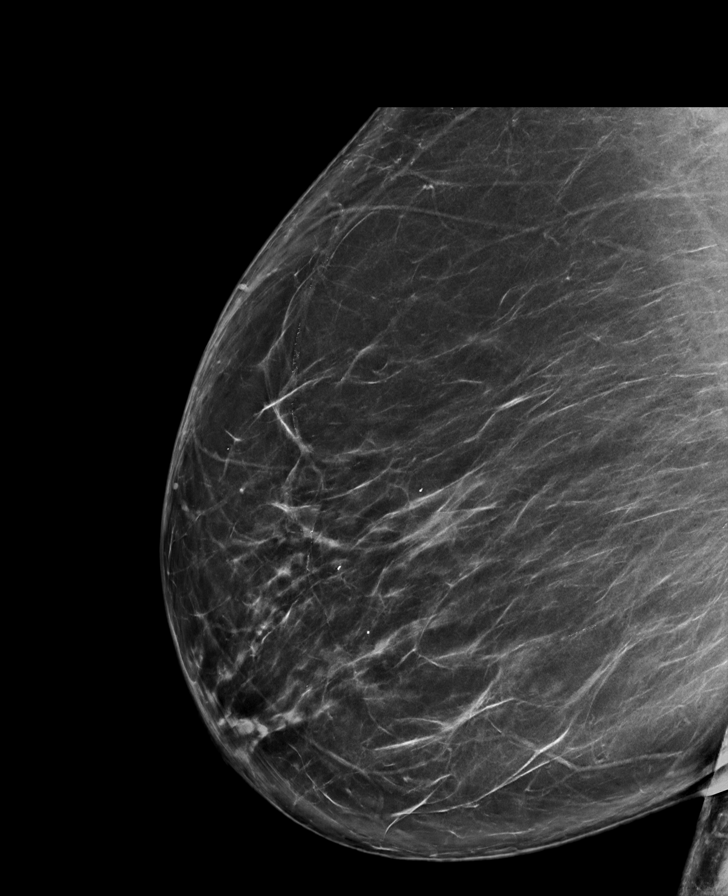

[L MLO synth-2D]
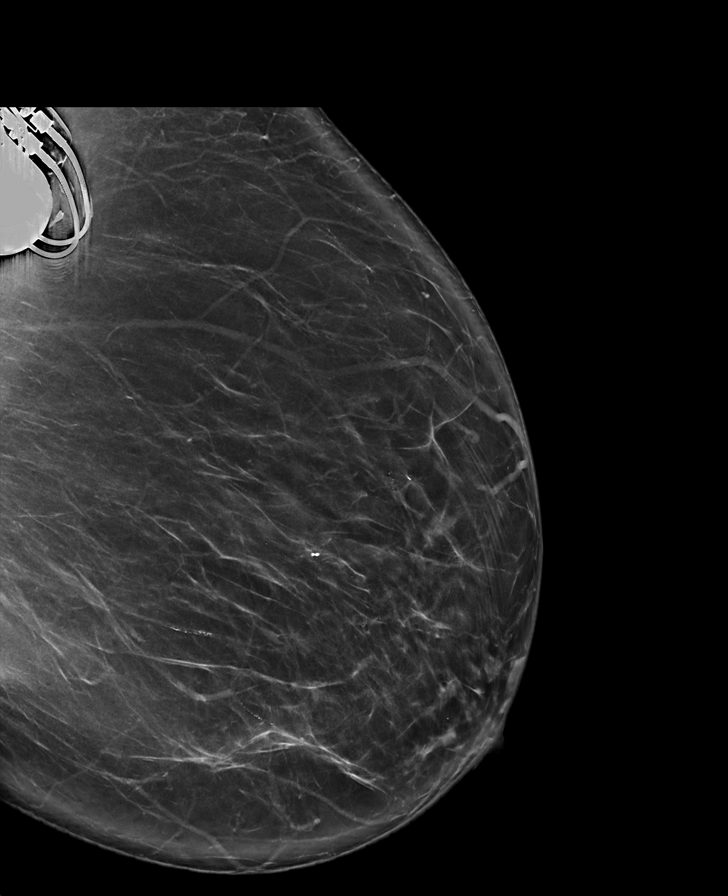

[L CC synth-2D]
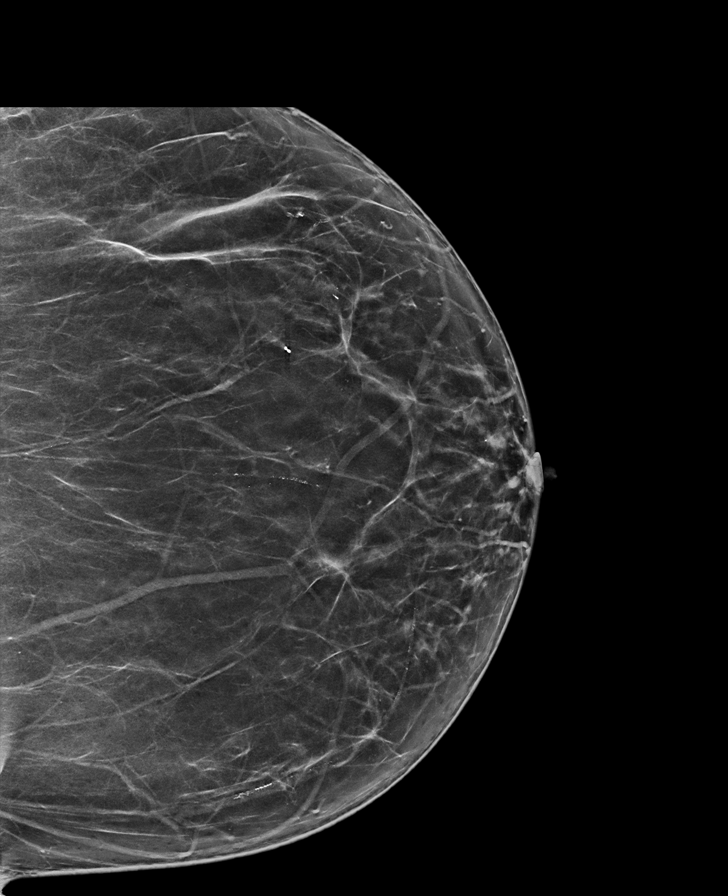

[R MLO tomo · tomo slice 45/89.0]
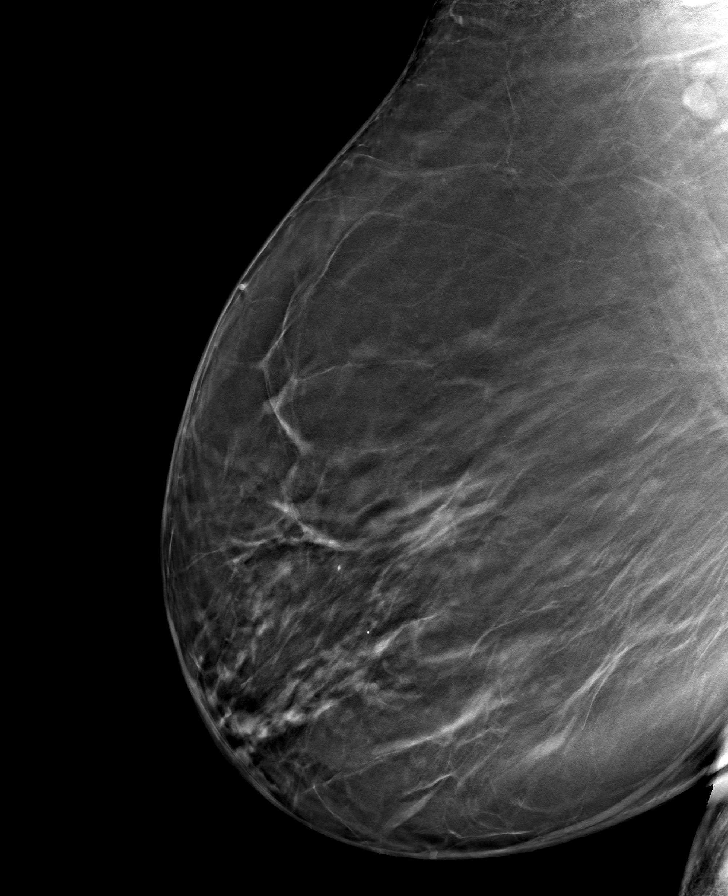

[R CC tomo · tomo slice 41/80.0]
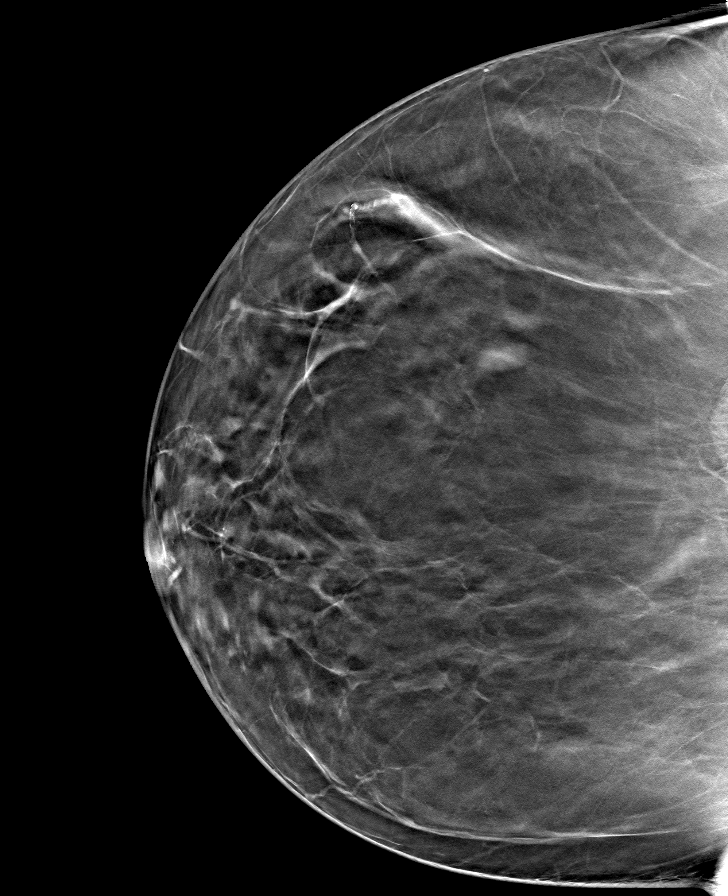

[L CC tomo · tomo slice 39/77.0]
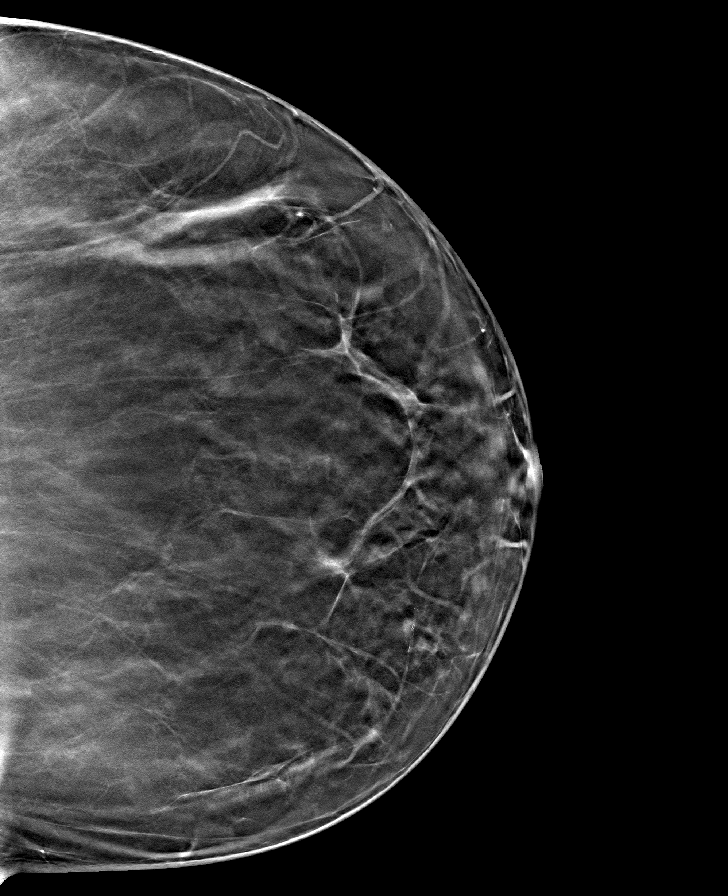

[L MLO tomo · tomo slice 47/92.0]
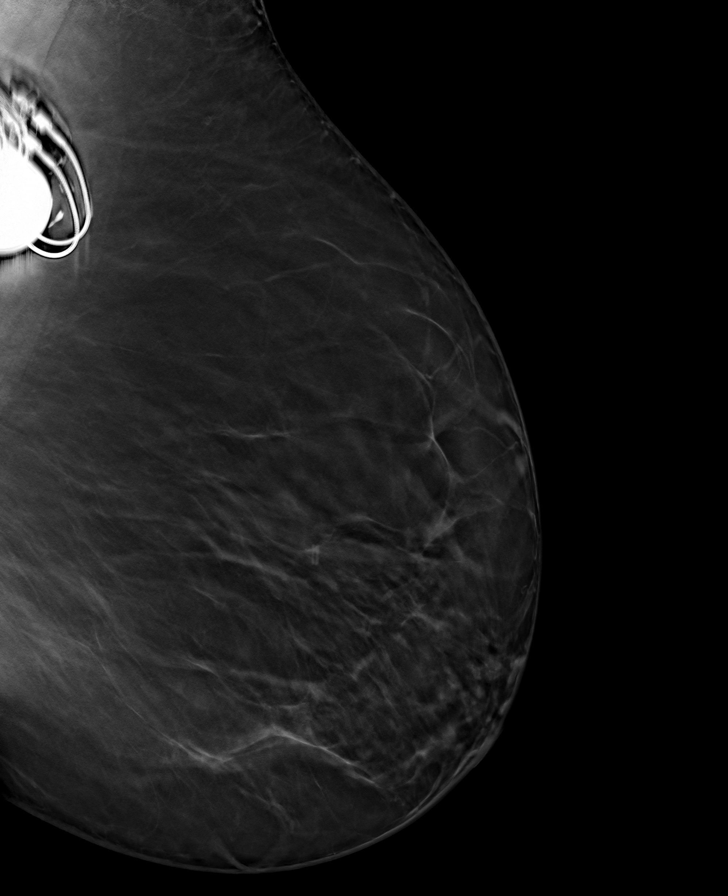

[8 of 24 positions shown; findings below may reference images not displayed]

ACR Breast Density Category b: There are scattered areas of
fibroglandular density.
FINDINGS: There are no findings suspicious for malignancy.
IMPRESSION: No mammographic evidence of malignancy. A result letter of this
screening mammogram will be mailed directly to the patient.

RECOMMENDATION:
Screening mammogram in one year. (Code:51-O-LD2)

BI-RADS CATEGORY  1: Negative.

## 2023-11-08 ENCOUNTER — Other Ambulatory Visit: Payer: Self-pay | Admitting: Internal Medicine

## 2023-11-08 DIAGNOSIS — Z1231 Encounter for screening mammogram for malignant neoplasm of breast: Secondary | ICD-10-CM

## 2023-12-15 ENCOUNTER — Ambulatory Visit: Payer: Self-pay

## 2023-12-28 ENCOUNTER — Ambulatory Visit: Payer: Medicare Other

## 2023-12-28 ENCOUNTER — Ambulatory Visit
Admission: RE | Admit: 2023-12-28 | Discharge: 2023-12-28 | Disposition: A | Discharge status: Home / Self Care | Payer: Medicare Other | Source: Ambulatory Visit | Attending: Internal Medicine | Admitting: Internal Medicine

## 2023-12-28 DIAGNOSIS — Z1231 Encounter for screening mammogram for malignant neoplasm of breast: Secondary | ICD-10-CM
